# Patient Record
Sex: Female | Born: 1975 | Race: Black or African American | Hispanic: No | Marital: Married | Smoking: Never smoker
Health system: Southern US, Community
[De-identification: ages and names within clinical notes are randomized; demographics above are authoritative.]

## PROBLEM LIST (undated history)

## (undated) DIAGNOSIS — B009 Herpesviral infection, unspecified: Secondary | ICD-10-CM

## (undated) DIAGNOSIS — D649 Anemia, unspecified: Secondary | ICD-10-CM

## (undated) DIAGNOSIS — O24419 Gestational diabetes mellitus in pregnancy, unspecified control: Secondary | ICD-10-CM

## (undated) DIAGNOSIS — Z5189 Encounter for other specified aftercare: Secondary | ICD-10-CM

## (undated) DIAGNOSIS — R809 Proteinuria, unspecified: Secondary | ICD-10-CM

## (undated) DIAGNOSIS — O09299 Supervision of pregnancy with other poor reproductive or obstetric history, unspecified trimester: Secondary | ICD-10-CM

## (undated) DIAGNOSIS — K5190 Ulcerative colitis, unspecified, without complications: Secondary | ICD-10-CM

## (undated) DIAGNOSIS — O3421 Maternal care for scar from previous cesarean delivery: Secondary | ICD-10-CM

## (undated) DIAGNOSIS — O34219 Maternal care for unspecified type scar from previous cesarean delivery: Secondary | ICD-10-CM

## (undated) DIAGNOSIS — O228X9 Other venous complications in pregnancy, unspecified trimester: Secondary | ICD-10-CM

## (undated) DIAGNOSIS — O9902 Anemia complicating childbirth: Secondary | ICD-10-CM

## (undated) DIAGNOSIS — B999 Unspecified infectious disease: Secondary | ICD-10-CM

## (undated) DIAGNOSIS — D259 Leiomyoma of uterus, unspecified: Secondary | ICD-10-CM

## (undated) HISTORY — DX: Proteinuria, unspecified: R80.9

## (undated) HISTORY — DX: Ulcerative colitis, unspecified, without complications: K51.90

## (undated) HISTORY — DX: Other venous complications in pregnancy, unspecified trimester: O22.8X9

## (undated) HISTORY — DX: Maternal care for scar from previous cesarean delivery: O34.21

## (undated) HISTORY — DX: Anemia, unspecified: D64.9

## (undated) HISTORY — PX: WISDOM TOOTH EXTRACTION: SHX21

## (undated) HISTORY — DX: Herpesviral infection, unspecified: B00.9

## (undated) HISTORY — DX: Leiomyoma of uterus, unspecified: D25.9

## (undated) HISTORY — PX: HERNIA REPAIR: SHX51

## (undated) HISTORY — DX: Supervision of pregnancy with other poor reproductive or obstetric history, unspecified trimester: O09.299

## (undated) HISTORY — DX: Unspecified infectious disease: B99.9

## (undated) HISTORY — PX: SIGMOIDOSCOPY: SUR1295

## (undated) HISTORY — DX: Encounter for other specified aftercare: Z51.89

## (undated) HISTORY — DX: Gestational diabetes mellitus in pregnancy, unspecified control: O24.419

---

## 1998-09-10 DIAGNOSIS — B999 Unspecified infectious disease: Secondary | ICD-10-CM

## 1998-09-10 HISTORY — DX: Unspecified infectious disease: B99.9

## 2007-09-11 DIAGNOSIS — D259 Leiomyoma of uterus, unspecified: Secondary | ICD-10-CM

## 2007-09-11 HISTORY — DX: Leiomyoma of uterus, unspecified: D25.9

## 2008-01-20 ENCOUNTER — Ambulatory Visit: Payer: Self-pay | Admitting: Hematology & Oncology

## 2008-05-11 ENCOUNTER — Encounter: Admission: RE | Admit: 2008-05-11 | Discharge: 2008-05-11 | Payer: Self-pay | Admitting: Obstetrics and Gynecology

## 2008-07-20 ENCOUNTER — Encounter: Admission: RE | Admit: 2008-07-20 | Discharge: 2008-07-20 | Payer: Self-pay | Admitting: Obstetrics and Gynecology

## 2008-08-15 ENCOUNTER — Inpatient Hospital Stay (HOSPITAL_COMMUNITY): Admission: AD | Admit: 2008-08-15 | Discharge: 2008-08-15 | Payer: Self-pay | Admitting: Obstetrics & Gynecology

## 2008-09-07 ENCOUNTER — Inpatient Hospital Stay (HOSPITAL_COMMUNITY): Admission: AD | Admit: 2008-09-07 | Discharge: 2008-09-11 | Payer: Self-pay | Admitting: Obstetrics and Gynecology

## 2011-01-23 NOTE — Op Note (Signed)
Katherine Mitchell, Katherine Mitchell           ACCOUNT NO.:  [id number]   MEDICAL RECORD NO.:  [id number]          PATIENT TYPE:  INP   LOCATION:  9137                          FACILITY:  [facility]   PHYSICIAN:  Servando Salina, M.D.DATE OF BIRTH:  05/09/1976   DATE OF PROCEDURE:  09/08/2008  DATE OF DISCHARGE:                               OPERATIVE REPORT   PREOPERATIVE DIAGNOSES:  Nonreassuring fetal tracing, class A2  gestational diabetes, term gestation.   PROCEDURE:  Primary cesarean section Kerr hysterotomy.   POSTOPERATIVE DIAGNOSES:  Nonreassuring fetal tracing, class A2  gestational diabetes, term gestation.   ANESTHESIA:  Spinal.   SURGEON:  Servando Salina, MD   ASSISTANT:  None.   INDICATIONS:  A 35 year old gravida 1, para 0 female at term with class  A2 gestational diabetes admitted on September 07, 2008, for induction of  labor.  The patient had Cytotec placement x2.  Subsequently developed  tachysystole and late decelerations.  Subcutaneous terbutaline was given  in order to try to space the contractions.  The patient subsequently  continued to have contractions every minute, half a minute to a minute  long with repetitive late decelerations.  Her cervical exam showed that  she was closed 60% -3.  Given the exam and inability to further assess  the fetus, decision was then made to proceed with a primary cesarean  section.  Surgical risk was reviewed with the patient.  Consent was  signed.  The patient was transferred to the operating room.   PROCEDURE:  Under adequate spinal anesthesia, the patient was placed in  the supine position with a left lateral tilt.  She was sterilely prepped  and draped in usual fashion.  Indwelling Foley catheter was sterilely  placed.  A 0.25% Marcaine was injected along the planned Pfannenstiel  skin incision site.  Pfannenstiel skin incision was then made, carried  down to the rectus fascia.  Rectus fascia was opened transversely.  Rectus  fascia was then bluntly and sharply dissected off the rectus  muscle in superior and inferior fashion.  The rectus muscles split in  midline.  The parietal peritoneum was entered sharply and extended.  The  vesicouterine peritoneum was well developed.  Vesicouterine peritoneum  was opened transversely.  Bladder was then bluntly dissected off the  lower uterine segment and displaced inferiorly using a bladder  retractor.  Curvilinear low transverse uterine incision was then made  and extended with bandage scissors.  Copious amount of amniotic fluid  was noted.  Subsequent delivery of a live female from the right occiput  anterior position was accomplished.  Baby had a cord around the neck  which was reducible.  Cord was clamped and cut.  The baby was  transferred to the awaiting pediatrician who assigned Apgars 8 and 9 at  one and five minutes.  Placenta was manually removed.  Uterine cavity  was cleaned of debris.  Uterine incision had no extension and was closed  in two layers, the first layer with 0 Monocryl running locked stitch,  second layer was imbricated using 0 Monocryl suture.  Bleeding on the  right was hemostased with  a figure-of-eight 0 Monocryl suture.  Normal  tubes and ovaries were noted bilaterally.  A small subserosal fibroid  posteriorly was noted about to 2.5 cm near the fundal area.  The  parietal peritoneum was closed with 2-0 Vicryl.  The rectus fascia with  0 Vicryl x2.  The subcutaneous area was irrigated, small bleeders  cauterized.  Interrupted with 2-0 plain suture, placed in the skin  approximated with Ethicon staples.   SPECIMENS:  Placenta not sent to pathology.   CORD PH:  7.23.   INTRAOPERATIVE FLUID:  3 liters.   URINE OUTPUT:  125 mL clear yellow urine.   ESTIMATED BLOOD LOSS:  1000 mL.   Sponge and instrument counts x2 was correct.  Complication was none.  The patient tolerated procedure well and was transferred to recovery  room in stable  condition.  Weight of the baby was 7 pounds 8 ounces.      Servando Salina, M.D.  Electronically Signed     [place]/MEDQ  D:  09/08/2008  T:  09/08/2008  Job:  [id number]

## 2011-01-26 NOTE — Discharge Summary (Signed)
NAMEBENNETTE, HASTY           ACCOUNT NO.:  [id number]   MEDICAL RECORD NO.:  [id number]          PATIENT TYPE:  INP   LOCATION:  9137                          FACILITY:  [facility]   PHYSICIAN:  Servando Salina, M.D.DATE OF BIRTH:  [birth year]   DATE OF ADMISSION:  09/07/2008  DATE OF DISCHARGE:  09/11/2008                               DISCHARGE SUMMARY   ADMISSION DIAGNOSES:  Class A2 gestational diabetes, term gestation.   DISCHARGE DIAGNOSES:  Term gestation delivered, class A2 gestational  diabetes, and nonreassuring fetal tracing.   PROCEDURES:  Primary cesarean section  Kerr hysterotomy.   HISTORY OF PRESENT ILLNESS:  A 35 year old gravida 1, para 0 female at  13 weeks with class A2 gestational diabetes, admitted for induction of  labor.   HOSPITAL COURSE:  The patient was admitted for induction of labor.  Cytotec x2 was placed.  The patient developed tachysystole and  subcutaneous terbutaline was given.  The patient then began having  spontaneous contractions.  She subsequently had repetitive late  decelerations.  Cervix was closed, 60%, -3.  She was contracting every  half to one minute on her own.  Given the nonreassuring fetal tracing,  the decision was made to proceed with a primary cesarean section.  Primary C-section was done, live female, cord around the neck x1, right  occiput anterior position, cord pH 7.23, Apgars 8 and 9, normal tubes  and ovaries.  Postoperatively, the patient did well.  She was not  continuing on her glyburide.  CBC on postop day #1 showed a hemoglobin  of 9.6, hematocrit of 29.8, white count of 10.3, and platelet count  186,000.  By postop day #3, the patient had no evidence of infection.  She was tolerating the regular diet, passed flatus, and was deemed well  to be discharged home.   DISPOSITION:  To home.   CONDITION:  Stable.   DISCHARGE MEDICATIONS:  1. Percocet 1-2 tablets every 4-6 hours p.r.n. pain.  2. Prenatal vitamins 1 p.o.  daily.  3. Motrin 800 mg 1 p.o. q.8 h. p.r.n. pain.   FOLLOWUP APPOINTMENTS:  At [facility] in 6 weeks.   DISCHARGE INSTRUCTIONS:  Per the postpartum booklet given.     Servando Salina, M.D.  Electronically Signed    /MEDQ  D:  10/31/2008  T:  11/01/2008  Job:  [id number]

## 2011-04-05 LAB — GC/CHLAMYDIA PROBE AMP, GENITAL
Chlamydia: NEGATIVE
Gonorrhea: NEGATIVE

## 2011-04-05 LAB — RPR: RPR: NONREACTIVE

## 2011-04-05 LAB — ANTIBODY SCREEN: Antibody Screen: NEGATIVE

## 2011-04-05 LAB — HIV ANTIBODY (ROUTINE TESTING W REFLEX): HIV: NONREACTIVE

## 2011-06-15 LAB — GLUCOSE, CAPILLARY
Glucose-Capillary: 119 mg/dL — ABNORMAL HIGH (ref 70–99)
Glucose-Capillary: 54 mg/dL — ABNORMAL LOW (ref 70–99)
Glucose-Capillary: 70 mg/dL (ref 70–99)
Glucose-Capillary: 83 mg/dL (ref 70–99)
Glucose-Capillary: 86 mg/dL (ref 70–99)

## 2011-06-15 LAB — CBC
HCT: 29.8 % — ABNORMAL LOW (ref 36.0–46.0)
HCT: 34.2 % — ABNORMAL LOW (ref 36.0–46.0)
Hemoglobin: 9.6 g/dL — ABNORMAL LOW (ref 12.0–15.0)
MCHC: 32.3 g/dL (ref 30.0–36.0)
MCHC: 32.7 g/dL (ref 30.0–36.0)
MCV: 88.7 fL (ref 78.0–100.0)
MCV: 89 fL (ref 78.0–100.0)
Platelets: 186 10*3/uL (ref 150–400)
RBC: 3.34 MIL/uL — ABNORMAL LOW (ref 3.87–5.11)
RBC: 3.85 MIL/uL — ABNORMAL LOW (ref 3.87–5.11)
RDW: 14.8 % (ref 11.5–15.5)
WBC: 10.3 10*3/uL (ref 4.0–10.5)
WBC: 8.4 10*3/uL (ref 4.0–10.5)

## 2011-06-15 LAB — RPR: RPR Ser Ql: NONREACTIVE

## 2011-06-20 ENCOUNTER — Encounter: Payer: Managed Care, Other (non HMO) | Attending: Obstetrics and Gynecology | Admitting: Dietician

## 2011-06-20 DIAGNOSIS — Z713 Dietary counseling and surveillance: Secondary | ICD-10-CM | POA: Insufficient documentation

## 2011-06-20 DIAGNOSIS — O99810 Abnormal glucose complicating pregnancy: Secondary | ICD-10-CM | POA: Insufficient documentation

## 2011-06-21 ENCOUNTER — Encounter: Payer: Self-pay | Admitting: Dietician

## 2011-06-21 NOTE — Progress Notes (Signed)
  Patient was seen on 06/20/2011 for Gestational Diabetes self-management class at the [facility]. The following learning objectives were met by the patient during this course:   States the definition of Gestational Diabetes  States why dietary management is important in controlling blood glucose  Describes the effects each nutrient has on blood glucose levels  Demonstrates ability to create a balanced meal plan  Demonstrates carbohydrate counting   States when to check blood glucose levels  Demonstrates proper blood glucose monitoring techniques  States the effect of stress and exercise on blood glucose levels  States the importance of limiting caffeine and abstaining from alcohol and smoking  Blood glucose monitor given: One Touch Ultra Mini Lot # [id number] X Exp: 12/2011 Gluose reading: 84  Patient instructed to monitor glucose levels: Fasting and two hours past each meal. FBS: 60 - <90 1 hour: <140 2 hour: <120  Patient received handouts:  Nutrition Diabetes and Pregnancy  Carbohydrate Counting List  Patient will be seen for follow-up as needed.

## 2011-08-27 ENCOUNTER — Ambulatory Visit (INDEPENDENT_AMBULATORY_CARE_PROVIDER_SITE_OTHER): Payer: Managed Care, Other (non HMO)

## 2011-08-27 DIAGNOSIS — J111 Influenza due to unidentified influenza virus with other respiratory manifestations: Secondary | ICD-10-CM

## 2011-09-11 DIAGNOSIS — Z5189 Encounter for other specified aftercare: Secondary | ICD-10-CM

## 2011-09-11 HISTORY — DX: Encounter for other specified aftercare: Z51.89

## 2011-09-11 HISTORY — PX: DILATION AND CURETTAGE OF UTERUS: SHX78

## 2011-09-11 HISTORY — DX: Maternal care for unspecified type scar from previous cesarean delivery: O34.219

## 2011-09-11 NOTE — L&D Delivery Note (Signed)
Delivery Note At 5:56 AM a viable female was delivered via VBAC, Spontaneous (Presentation:LOA ;  ).  APGAR:9, 9 , ; weight .   Placenta status: Intact, Spontaneous.  Cord:3 vc  with the following complications:none .  Cord pH:   Anesthesia: Epidural  Episiotomy: none Lacerations: none Suture Repair: n/a Est. Blood Loss 350(mL):   Mom to postpartum.  Baby to nursery-stable.  Brieonna Crutcher DARLENE 11/12/2011, 6:04 AM

## 2011-10-16 LAB — STREP B DNA PROBE: GBS: NEGATIVE

## 2011-11-06 ENCOUNTER — Other Ambulatory Visit: Payer: Self-pay | Admitting: Obstetrics and Gynecology

## 2011-11-07 ENCOUNTER — Telehealth (HOSPITAL_COMMUNITY): Payer: Self-pay | Admitting: *Deleted

## 2011-11-07 ENCOUNTER — Encounter (HOSPITAL_COMMUNITY): Payer: Self-pay | Admitting: *Deleted

## 2011-11-07 NOTE — Telephone Encounter (Signed)
Preadmission screen  

## 2011-11-11 ENCOUNTER — Inpatient Hospital Stay (HOSPITAL_COMMUNITY)
Admission: RE | Admit: 2011-11-11 | Discharge: 2011-11-14 | DRG: 372 | Disposition: A | Discharge status: Home / Self Care | Payer: BC Managed Care – PPO | Source: Ambulatory Visit | Attending: Obstetrics and Gynecology | Admitting: Obstetrics and Gynecology

## 2011-11-11 DIAGNOSIS — O24419 Gestational diabetes mellitus in pregnancy, unspecified control: Secondary | ICD-10-CM | POA: Diagnosis present

## 2011-11-11 DIAGNOSIS — O9903 Anemia complicating the puerperium: Secondary | ICD-10-CM | POA: Diagnosis not present

## 2011-11-11 DIAGNOSIS — O34219 Maternal care for unspecified type scar from previous cesarean delivery: Secondary | ICD-10-CM | POA: Diagnosis present

## 2011-11-11 DIAGNOSIS — O9902 Anemia complicating childbirth: Secondary | ICD-10-CM | POA: Diagnosis present

## 2011-11-11 DIAGNOSIS — D649 Anemia, unspecified: Secondary | ICD-10-CM | POA: Diagnosis not present

## 2011-11-11 DIAGNOSIS — O99814 Abnormal glucose complicating childbirth: Principal | ICD-10-CM | POA: Diagnosis present

## 2011-11-11 DIAGNOSIS — O09529 Supervision of elderly multigravida, unspecified trimester: Secondary | ICD-10-CM | POA: Diagnosis present

## 2011-11-11 HISTORY — DX: Anemia complicating childbirth: O99.02

## 2011-11-11 LAB — CBC
Hemoglobin: 10.7 g/dL — ABNORMAL LOW (ref 12.0–15.0)
MCH: 26.8 pg (ref 26.0–34.0)
MCHC: 32.1 g/dL (ref 30.0–36.0)
MCV: 83.5 fL (ref 78.0–100.0)
RBC: 3.99 MIL/uL (ref 3.87–5.11)

## 2011-11-11 LAB — GLUCOSE, CAPILLARY

## 2011-11-11 MED ORDER — OXYTOCIN 20 UNITS IN LACTATED RINGERS INFUSION - SIMPLE: 125.0000 mL/h | Freq: Once | INTRAVENOUS | Status: DC

## 2011-11-11 MED ORDER — OXYTOCIN 20 UNITS IN LACTATED RINGERS INFUSION - SIMPLE
1.0000 m[IU]/min | INTRAVENOUS | Status: DC
  Administered 2011-11-11: 1 m[IU]/min via INTRAVENOUS
  Filled 2011-11-11: qty 1000

## 2011-11-11 MED ORDER — OXYTOCIN 20 UNITS IN LACTATED RINGERS INFUSION - SIMPLE: 1.0000 m[IU]/min | INTRAVENOUS | Status: DC

## 2011-11-11 MED ORDER — NALBUPHINE HCL 10 MG/ML IJ SOLN
10.0000 mg | INTRAMUSCULAR | Status: DC | PRN
  Administered 2011-11-11: 10 mg via INTRAVENOUS

## 2011-11-11 MED ORDER — ONDANSETRON HCL 4 MG/2ML IJ SOLN
4.0000 mg | Freq: Four times a day (QID) | INTRAMUSCULAR | Status: DC | PRN
  Administered 2011-11-11: 4 mg via INTRAVENOUS
  Filled 2011-11-11: qty 2

## 2011-11-11 MED ORDER — OXYCODONE-ACETAMINOPHEN 5-325 MG PO TABS: 1.0000 | ORAL_TABLET | ORAL | Status: DC | PRN

## 2011-11-11 MED ORDER — ACETAMINOPHEN 325 MG PO TABS: 650.0000 mg | ORAL_TABLET | ORAL | Status: DC | PRN

## 2011-11-11 MED ORDER — CITRIC ACID-SODIUM CITRATE 334-500 MG/5ML PO SOLN: 30.0000 mL | ORAL | Status: DC | PRN

## 2011-11-11 MED ORDER — LIDOCAINE HCL (PF) 1 % IJ SOLN
30.0000 mL | INTRAMUSCULAR | Status: DC | PRN
  Filled 2011-11-11: qty 30

## 2011-11-11 MED ORDER — LACTATED RINGERS IV SOLN
500.0000 mL | INTRAVENOUS | Status: DC | PRN
  Administered 2011-11-12: 300 mL via INTRAVENOUS

## 2011-11-11 MED ORDER — TERBUTALINE SULFATE 1 MG/ML IJ SOLN: 0.2500 mg | Freq: Once | INTRAMUSCULAR | Status: AC | PRN

## 2011-11-11 MED ORDER — OXYTOCIN 10 UNIT/ML IJ SOLN: 10.0000 [IU] | Freq: Once | INTRAMUSCULAR | Status: DC

## 2011-11-11 MED ORDER — OXYTOCIN BOLUS FROM INFUSION
500.0000 mL | Freq: Once | INTRAVENOUS | Status: AC
  Administered 2011-11-12: 500 mL via INTRAVENOUS
  Filled 2011-11-11: qty 500

## 2011-11-11 MED ORDER — LACTATED RINGERS IV SOLN
INTRAVENOUS | Status: DC
  Administered 2011-11-11 – 2011-11-12 (×3): via INTRAVENOUS

## 2011-11-11 MED ORDER — NALBUPHINE SYRINGE 5 MG/0.5 ML
10.0000 mg | INJECTION | INTRAMUSCULAR | Status: DC | PRN
  Administered 2011-11-11: 10 mg via INTRAVENOUS
  Filled 2011-11-11 (×3): qty 1

## 2011-11-11 MED ORDER — ZOLPIDEM TARTRATE 10 MG PO TABS: 10.0000 mg | ORAL_TABLET | Freq: Every evening | ORAL | Status: DC | PRN

## 2011-11-11 MED ORDER — IBUPROFEN 600 MG PO TABS
600.0000 mg | ORAL_TABLET | Freq: Four times a day (QID) | ORAL | Status: DC | PRN
  Administered 2011-11-12: 600 mg via ORAL
  Filled 2011-11-11: qty 1

## 2011-11-11 NOTE — H&P (Signed)
Katherine Mitchell is a 36 y.o. female now @ 39 6/7 wks presenting for induction 2nd to Class A2 GDM. Hx notable for previous LTCS History OB History    Grav Para Term Preterm Abortions TAB SAB Ect Mult Living   3 1 1  1  1   1      Past Medical History  Diagnosis Date  . Pregnancy with other poor obstetric history   . Previous cesarean delivery, unspecified as to episode of care or not applicable   . Ulcerative colitis, unspecified   . Leiomyoma of uterus, unspecified   . Other venous complication of pregnancy and the puerperium, unspecified as to episode of care     hemorrhoids  . Proteinuria   . Anemia   . History of maternal blood transfusion, currently pregnant   . Gestational diabetes     glyburide   Past Surgical History  Procedure Date  . Cesarean section   . Wisdom tooth extraction   . Hernia repair     umbilical  . Sigmoidoscopy    Family History: family history includes Diabetes in her mother. Social History:  reports that she has never smoked. She has never used smokeless tobacco. She reports that she does not drink alcohol or use illicit drugs.  ROS neg    Blood pressure 107/69, pulse 96, temperature 98.9 F (37.2 C), temperature source Oral, resp. rate 18, height 5' 5"  (1.651 m), weight 187 lb (84.823 kg). Maternal Exam:  Uterine Assessment: Contraction strength is mild.  Contraction frequency is irregular.   Abdomen: Patient reports no abdominal tenderness. Surgical scars: low transverse.   Estimated fetal weight is 8lb.   Fetal presentation: vertex  Introitus: Normal vulva. Normal vagina.  Ferning test: not done.  Nitrazine test: not done. Amniotic fluid character: not assessed.  Pelvis: adequate for delivery.   Cervix: Cervix evaluated by digital exam.     Fetal Exam Fetal Monitor Review: Mode: ultrasound.   Baseline rate: 120.  Variability: moderate (6-25 bpm).   Pattern: accelerations present.    Fetal State Assessment: Category I - tracings  are normal.     Physical Exam  Constitutional: She is oriented to person, place, and time. She appears well-developed and well-nourished.  HENT:  Head: Normocephalic.  Neck: Neck supple.  Respiratory: Breath sounds normal.  Musculoskeletal: Normal range of motion.  Neurological: She is alert and oriented to person, place, and time.  Skin: Skin is warm and dry.  Psychiatric: She has a normal mood and affect.   VE: 1/70/-4 soft Prenatal labs: ABO, Rh: O/Positive/-- (07/26 0000) Antibody: Negative (07/26 0000) Rubella: Immune (07/26 0000) RPR: Nonreactive (07/26 0000)  HBsAg: Negative (07/26 0000)  HIV: Non-reactive (07/26 0000)  GBS: Negative (02/05 0000)   Assessment/Plan: Class A2 GDM Prev LTCS Term gestation  P) admit BS q 4 hrs, routine labs. Intracervical balloon and low dose pitocin. Analgesic prn  Jaelen Soth A 11/11/2011, 6:14 PM

## 2011-11-11 NOTE — Progress Notes (Signed)
Intracervical balloon placed without difficulty. Reactive NST

## 2011-11-12 ENCOUNTER — Encounter (HOSPITAL_COMMUNITY): Payer: Self-pay | Admitting: Anesthesiology

## 2011-11-12 ENCOUNTER — Inpatient Hospital Stay (HOSPITAL_COMMUNITY): Admission: RE | Admit: 2011-11-12 | Payer: Managed Care, Other (non HMO) | Source: Ambulatory Visit

## 2011-11-12 ENCOUNTER — Inpatient Hospital Stay (HOSPITAL_COMMUNITY): Payer: BC Managed Care – PPO | Admitting: Anesthesiology

## 2011-11-12 ENCOUNTER — Inpatient Hospital Stay (HOSPITAL_COMMUNITY): Admission: AD | Admit: 2011-11-12 | Payer: Self-pay | Source: Ambulatory Visit | Admitting: Obstetrics and Gynecology

## 2011-11-12 ENCOUNTER — Encounter (HOSPITAL_COMMUNITY): Payer: Self-pay

## 2011-11-12 DIAGNOSIS — O34219 Maternal care for unspecified type scar from previous cesarean delivery: Secondary | ICD-10-CM | POA: Diagnosis present

## 2011-11-12 DIAGNOSIS — O24419 Gestational diabetes mellitus in pregnancy, unspecified control: Secondary | ICD-10-CM | POA: Diagnosis present

## 2011-11-12 LAB — ABO/RH: ABO/RH(D): O POS

## 2011-11-12 LAB — GLUCOSE, CAPILLARY: Glucose-Capillary: 88 mg/dL (ref 70–99)

## 2011-11-12 MED ORDER — BENZOCAINE-MENTHOL 20-0.5 % EX AERO: 1.0000 "application " | INHALATION_SPRAY | CUTANEOUS | Status: DC | PRN

## 2011-11-12 MED ORDER — ONDANSETRON HCL 4 MG PO TABS: 4.0000 mg | ORAL_TABLET | ORAL | Status: DC | PRN

## 2011-11-12 MED ORDER — SENNOSIDES-DOCUSATE SODIUM 8.6-50 MG PO TABS
2.0000 | ORAL_TABLET | Freq: Every day | ORAL | Status: DC
  Administered 2011-11-12 – 2011-11-13 (×2): 2 via ORAL

## 2011-11-12 MED ORDER — PRENATAL MULTIVITAMIN CH
1.0000 | ORAL_TABLET | Freq: Every day | ORAL | Status: DC
  Administered 2011-11-12 – 2011-11-14 (×3): 1 via ORAL
  Filled 2011-11-12 (×3): qty 1

## 2011-11-12 MED ORDER — DIPHENHYDRAMINE HCL 25 MG PO CAPS: 25.0000 mg | ORAL_CAPSULE | Freq: Four times a day (QID) | ORAL | Status: DC | PRN

## 2011-11-12 MED ORDER — WITCH HAZEL-GLYCERIN EX PADS: 1.0000 "application " | MEDICATED_PAD | CUTANEOUS | Status: DC | PRN

## 2011-11-12 MED ORDER — ZOLPIDEM TARTRATE 5 MG PO TABS: 5.0000 mg | ORAL_TABLET | Freq: Every evening | ORAL | Status: DC | PRN

## 2011-11-12 MED ORDER — LANOLIN HYDROUS EX OINT: TOPICAL_OINTMENT | CUTANEOUS | Status: DC | PRN

## 2011-11-12 MED ORDER — LACTATED RINGERS IV SOLN: 500.0000 mL | Freq: Once | INTRAVENOUS | Status: DC

## 2011-11-12 MED ORDER — DIBUCAINE 1 % RE OINT: 1.0000 "application " | TOPICAL_OINTMENT | RECTAL | Status: DC | PRN

## 2011-11-12 MED ORDER — IBUPROFEN 600 MG PO TABS
600.0000 mg | ORAL_TABLET | Freq: Four times a day (QID) | ORAL | Status: DC
  Administered 2011-11-12 – 2011-11-14 (×9): 600 mg via ORAL
  Filled 2011-11-12 (×9): qty 1

## 2011-11-12 MED ORDER — LIDOCAINE HCL (PF) 1 % IJ SOLN
INTRAMUSCULAR | Status: DC | PRN
  Administered 2011-11-12 (×2): 4 mL

## 2011-11-12 MED ORDER — ONDANSETRON HCL 4 MG/2ML IJ SOLN: 4.0000 mg | INTRAMUSCULAR | Status: DC | PRN

## 2011-11-12 MED ORDER — OXYCODONE-ACETAMINOPHEN 5-325 MG PO TABS: 1.0000 | ORAL_TABLET | ORAL | Status: DC | PRN

## 2011-11-12 MED ORDER — SIMETHICONE 80 MG PO CHEW: 80.0000 mg | CHEWABLE_TABLET | ORAL | Status: DC | PRN

## 2011-11-12 MED ORDER — TETANUS-DIPHTH-ACELL PERTUSSIS 5-2.5-18.5 LF-MCG/0.5 IM SUSP
0.5000 mL | Freq: Once | INTRAMUSCULAR | Status: AC
  Administered 2011-11-14: 0.5 mL via INTRAMUSCULAR
  Filled 2011-11-12 (×2): qty 0.5

## 2011-11-12 MED ORDER — EPHEDRINE 5 MG/ML INJ: 10.0000 mg | INTRAVENOUS | Status: DC | PRN

## 2011-11-12 MED ORDER — FENTANYL 2.5 MCG/ML BUPIVACAINE 1/10 % EPIDURAL INFUSION (WH - ANES)
INTRAMUSCULAR | Status: DC | PRN
  Administered 2011-11-12: 14 mL/h via EPIDURAL

## 2011-11-12 MED ORDER — PHENYLEPHRINE 40 MCG/ML (10ML) SYRINGE FOR IV PUSH (FOR BLOOD PRESSURE SUPPORT)
80.0000 ug | PREFILLED_SYRINGE | INTRAVENOUS | Status: DC | PRN
  Filled 2011-11-12: qty 5

## 2011-11-12 MED ORDER — FENTANYL 2.5 MCG/ML BUPIVACAINE 1/10 % EPIDURAL INFUSION (WH - ANES)
14.0000 mL/h | INTRAMUSCULAR | Status: DC
  Filled 2011-11-12 (×2): qty 60

## 2011-11-12 MED ORDER — EPHEDRINE 5 MG/ML INJ
10.0000 mg | INTRAVENOUS | Status: DC | PRN
  Filled 2011-11-12: qty 4

## 2011-11-12 MED ORDER — DIPHENHYDRAMINE HCL 50 MG/ML IJ SOLN
12.5000 mg | INTRAMUSCULAR | Status: DC | PRN
Start: 2011-11-12 — End: 2011-11-12

## 2011-11-12 MED ORDER — PHENYLEPHRINE 40 MCG/ML (10ML) SYRINGE FOR IV PUSH (FOR BLOOD PRESSURE SUPPORT): 80.0000 ug | PREFILLED_SYRINGE | INTRAVENOUS | Status: DC | PRN

## 2011-11-12 NOTE — Progress Notes (Signed)
INTERVAL NOTE:  S:  Sitting in bed, kangaroo care, plans to breastfeed, min cramping, (+) voids, moderate bleed, denies HA/NV/dizziness  O:  VSS, AAO x 3, NAD  FF bellow U  Moderate lochia  A / P:   PPD #0 s/p VBAC, GDM A2 delivered  Stable post partum  Routine PP orders  Leelan Rajewski, CNM, MSN  11/12/2011 11:17 AM

## 2011-11-12 NOTE — Progress Notes (Signed)
S; more comfortable since epidural SROM clear fluid @ 11:57 pm Foley balloon out  O:  BP 101/57 Pitocin 5 MIU  VE 5/80/-2 ROP asynclytic  IUPC placed.   BS=85 Tracing: baseline 120 reactive. (-)early decel (+) variable x 1 Ctx q 2 mins  Imp: Prev c/s desires VBAC Class A2  GDM Term gestation  P) exaggerated right sims. Cont pitocin

## 2011-11-12 NOTE — Progress Notes (Signed)
Garwin Brothers, MD, notified of tachysystole and SVE. Orders received to turn pitocin down to 2 milliunits.

## 2011-11-12 NOTE — Progress Notes (Signed)
Called for pt with epidural  9 cm (0 to +1 station). On arrival pt delivered w/ intact perineum

## 2011-11-12 NOTE — Anesthesia Preprocedure Evaluation (Signed)
Anesthesia Evaluation  Patient identified by MRN, date of birth, ID band Patient awake    Reviewed: Allergy & Precautions, H&P , Patient's Chart, lab work & pertinent test results  Airway Mallampati: III TM Distance: >3 FB Neck ROM: full    Dental No notable dental hx. (+) Teeth Intact   Pulmonary neg pulmonary ROS,  breath sounds clear to auscultation  Pulmonary exam normal       Cardiovascular negative cardio ROS  Rhythm:regular Rate:Normal     Neuro/Psych negative neurological ROS  negative psych ROS   GI/Hepatic Neg liver ROS, PUD,   Endo/Other  Diabetes mellitus-, Well Controlled, Gestational  Renal/GU negative Renal ROS  negative genitourinary   Musculoskeletal   Abdominal Normal abdominal exam  (+)   Peds  Hematology negative hematology ROS (+)   Anesthesia Other Findings   Reproductive/Obstetrics (+) Pregnancy                           Anesthesia Physical Anesthesia Plan  ASA: II  Anesthesia Plan: Epidural   Post-op Pain Management:    Induction:   Airway Management Planned:   Additional Equipment:   Intra-op Plan:   Post-operative Plan:   Informed Consent: I have reviewed the patients History and Physical, chart, labs and discussed the procedure including the risks, benefits and alternatives for the proposed anesthesia with the patient or authorized representative who has indicated his/her understanding and acceptance.     Plan Discussed with: Anesthesiologist and Surgeon  Anesthesia Plan Comments:         Anesthesia Quick Evaluation

## 2011-11-12 NOTE — Anesthesia Procedure Notes (Signed)
Epidural Patient location during procedure: OB Start time: 11/12/2011 12:35 AM  Staffing Anesthesiologist: Tola Meas A. Performed by: anesthesiologist   Preanesthetic Checklist Completed: patient identified, site marked, surgical consent, pre-op evaluation, timeout performed, IV checked, risks and benefits discussed and monitors and equipment checked  Epidural Patient position: sitting Prep: site prepped and draped and DuraPrep Patient monitoring: continuous pulse ox and blood pressure Approach: midline Injection technique: LOR air  Needle:  Needle type: Tuohy  Needle gauge: 17 G Needle length: 9 cm Needle insertion depth: 6 cm Catheter type: closed end flexible Catheter size: 19 Gauge Catheter at skin depth: 11 cm Test dose: negative and Other  Assessment Events: blood not aspirated, injection not painful, no injection resistance, negative IV test and no paresthesia  Additional Notes Patient identified. Risks and benefits discussed including failed block, incomplete  Pain control, post dural puncture headache, nerve damage, paralysis, blood pressure Changes, nausea, vomiting, reactions to medications-both toxic and allergic and post Partum back pain. All questions were answered. Patient expressed understanding and wished to proceed. Sterile technique was used throughout procedure. Epidural site was Dressed with sterile barrier dressing. No paresthesias, signs of intravascular injection Or signs of intrathecal spread were encountered.  Patient was more comfortable after the epidural was dosed. Please see RN's note for documentation of vital signs and FHR which are stable.

## 2011-11-13 LAB — CBC
HCT: 29.7 % — ABNORMAL LOW (ref 36.0–46.0)
Hemoglobin: 9.6 g/dL — ABNORMAL LOW (ref 12.0–15.0)
MCHC: 32.3 g/dL (ref 30.0–36.0)

## 2011-11-13 NOTE — Progress Notes (Signed)
Patient ID: Katherine Mitchell, female   DOB: 1976/05/09, 36 y.o.   MRN: [id number]  PPD 1 SVD  S:  Reports feeling well / tired mostly             Tolerating po/ No nausea or vomiting             Bleeding is light             Pain controlled withmotrin and percocet             Up ad lib / ambulatory  Newborn breast feeding  / Circumcision requested today   O:  A & O x 3 NAD             VS: Blood pressure 85/49, pulse 85, temperature 97.7 F (36.5 C), temperature source Oral, resp. rate 18, height 5' 5"  (1.651 m), weight 84.823 kg (187 lb), SpO2 98.00%, unknown if currently breastfeeding.  LABS: WBC/Hgb/Hct/Plts:  16.4/9.6/29.7/181 (03/05 0525)   Lungs: Clear and unlabored  Heart: regular rate and rhythm / no mumurs  Abdomen: soft, non-tender, non-distended              Fundus: firm, non-tender, U-1  Perineum: mild edema - ice pack in place  Lochia: light  Extremities: no edema, no calf pain or tenderness    A: PPD # 1 SVD / VBAC   Doing well - stable status  P:  Routine post partum orders  Newborn circ today             Discharge in am - will notify nurse if decides to go home early this pm after circ  Matheson Vandehei, Lavella Lemons 11/13/2011, 9:00 AM

## 2011-11-13 NOTE — Anesthesia Postprocedure Evaluation (Signed)
  Anesthesia Post-op Note  Patient: Katherine Mitchell  Procedure(s) Performed: * No procedures listed *  Patient Location: Mother/Baby  Anesthesia Type: Epidural  Level of Consciousness: awake  Airway and Oxygen Therapy: Patient Spontanous Breathing  Post-op Pain: none  Post-op Assessment: Patient's Cardiovascular Status Stable, Respiratory Function Stable, No signs of Nausea or vomiting, Adequate PO intake, Pain level controlled, No headache, No backache, No residual numbness and No residual motor weakness  Post-op Vital Signs: Reviewed and stable  Complications: No apparent anesthesia complications

## 2011-11-14 ENCOUNTER — Encounter (HOSPITAL_COMMUNITY): Payer: Self-pay

## 2011-11-14 DIAGNOSIS — O9902 Anemia complicating childbirth: Secondary | ICD-10-CM

## 2011-11-14 HISTORY — DX: Anemia complicating childbirth: O99.02

## 2011-11-14 MED ORDER — FERRALET 90 90-1 MG PO TABS: 1.0000 | ORAL_TABLET | Freq: Every day | ORAL | Status: DC

## 2011-11-14 MED ORDER — IBUPROFEN 600 MG PO TABS: 600.0000 mg | ORAL_TABLET | Freq: Four times a day (QID) | ORAL | Status: AC

## 2011-11-14 NOTE — Discharge Summary (Signed)
Obstetric Discharge Summary Reason for Admission: induction of labor and gestational diabetes class A2, trial of labor after cesarean section Prenatal Procedures: ultrasound Intrapartum Procedures: Successful VBAC Postpartum Procedures: none Complications-Operative and Postpartum: none Hemoglobin  Date Value Range Status  11/13/2011 9.6* 12.0-15.0 (g/dL) Final     HCT  Date Value Range Status  11/13/2011 29.7* 36.0-46.0 (%) Final    Discharge Diagnoses: Term Pregnancy-delivered Maternal anemia, mild Discharge Information: Date: 11/14/2011 Activity: pelvic rest Diet: routine Medications: PNV, Ibuprofen and Iron Condition: stable Instructions: refer to practice specific booklet Discharge to: home Follow-up Information    Follow up with COUSINS,SHERONETTE A, MD in 6 weeks.   Contact information:   [place] [phone]          Newborn Data: Live born female "Darlyn Chamber" Birth Weight: 8 lb 6.4 oz (3810 g) APGAR: 9, 9  Continues in-patient on double phototherapy, anticipate discharge home tomorrow, mother to room in.  PAUL,DANIELA 11/14/2011, 9:30 AM  Reviewed, agree w note and plan. --V.Benjie Karvonen, MD

## 2011-11-14 NOTE — Discharge Instructions (Signed)
Breast Pumping Tips Pumping your breast milk is a good way to stimulate milk production and have a steady supply of breast milk for your infant. Pumping is most helpful during your infant's growth spurts, when involving dad or a family member, or when you are away. There are several types of pumps available. They can be purchased at a baby or maternity store. You can begin pumping soon after delivery, but some experts believe that you should wait about four weeks to give your infant a bottle. In general, the more you breastfeed or pump, the more milk you will have for your infant. It is also important to take good care of yourself. This will reduce stress and help your body to create a healthy supply of milk. Your caregiver or lactation consultant can give you the information and support you need in your efforts to breastfeed your infant. PUMPING BREAST MILK  Follow the tips below for successful breast pumping. Take care of yourself.  Drink enough water or fluids to keep urine clear or pale yellow. You may notice a thirsty feeling while breastfeeding. This is because your body needs more water to make breast milk. Keep a large water bottle handy. Make healthy drink choices such as unsweetened fruit juice, milk and water. Limit soda, coffee, and alcohol (wait 2 hours to feed or pump if you have an alcoholic drink.)   Eat a healthy, well-balanced diet rich in fruits, vegetables, and whole grains.   Exercise as recommended by your caregiver.   Get plenty of sleep. Sleep when your infant sleeps. Ask friends and family for help if you need time to nap or rest.   Do not smoke. Smoking can lower your milk supply and harm your infant. If you need help quitting, ask your caregiver for a program recommendation.   Ask your caregiver about birth control options. Birth control pills may lower your milk supply. You may be advised to use condoms or other forms of birth control.  Relax and pump Stimulating your  let-down reflex is the key to successful and effective pumping. This makes the milk in all parts of the breast flow more freely.   It is easier to pump breast milk (and breastfeed) while you are relaxed. Find techniques that work for you. Quiet private spaces, breast massage, soothing heat placed on the breast, music, and pictures or a tape recording of your infant may help you to relax and "let down" your milk. If you have difficulty with your let down, try smelling one of your infant's blankets or an item of clothing he or she has worn while you are pumping.   When pumping, place the special suction cup (flange) directly over the nipple. It may be uncomfortable and cause nipple damage if it is not placed properly or is the wrong size. Applying a small amount of purified or modified lanolin to your nipple and the areola may help increase your comfort level. Also, you can change the speed and suction of many electric pumps to your comfort level. Your caregiver or lactation consultant can help you with this.   If pumping continues to be painful, or you feel you are not getting very much milk when you pump, you may need a different type of pump. A lactation consultant can help you determine if this is the case.   If you are with your infant, feed him or her on demand and try pumping after each feeding. This will boost your production, even if milk  does not come out. You may not be able to pump much milk at first, but keep up the routine, and this will change.   If you are working or away from your infant for several hours, try pumping for about 15 minutes every 2 to 3 hours. Pump both breasts at the same time if you can.   If your infant has a formula feeding, make sure you pump your milk around the same time to maintain your supply.   Begin pumping breast milk a few weeks before you return to work. This will help you develop techniques that work for you and will be able to store extra milk.   Find a  source of breastfeeding information that works well for you.  TIPS FOR STORING BREAST MILK  Store breast milk in a sealable sterile bag, jar, or container provided with your pumping supplies.   Store milk in small amounts close to what your infant is drinking at each feeding.   Cool pumped milk in a refrigerator or cooler. Pumped milk can last at the back of the refrigerator for 3 to 8 days.   Place cooled milk at the back of the freezer for up to 3 months.   Thaw the milk in its container or bag in warm water up to 24 hours in advance. Do not use a microwave to thaw or heat milk. Do not refreeze the milk after it has been thawed.   Breast milk is safe to drink when left at room temperature (mid 70s or colder) for 4 to 8 hours. After that, throw it away.   Milk fat can separate and look funny. The color can vary slightly from day to day. This is normal. Always shake the milk before using it to mix the fat with the more watery portion.  SEEK MEDICAL CARE IF:   You are having trouble pumping or feeding your infant.   You are concerned that you are not making enough milk.   You have nipple pain, soreness, or redness.   You have other questions or concerns related to you or your infant.  Document Released: 02/14/2010 Document Revised: 08/16/2011 Document Reviewed: 02/14/2010 [facility] Patient Information 2012 [place].Iron Deficiency Anemia There are many types of anemia. Iron deficiency anemia is the most common. Iron deficiency anemia is a decrease in the number of red blood cells caused by too little iron. Without enough iron, your body does not produce enough hemoglobin. Hemoglobin is a substance in red blood cells that carries oxygen to the body's tissues. Iron deficiency anemia may leave you tired and short of breath. CAUSES   Lack of iron in the diet.   This may be seen in infants and children, because there is little iron in milk.   This may be seen in adults who do not  eat enough iron-rich foods.   This may be seen in pregnant or breastfeeding women who do not take iron supplements. There is a much higher need for iron intake at these times.   Poor absorption of iron, as seen with intestinal disorders.   Intestinal bleeding.   Heavy periods.  SYMPTOMS  Mild anemia may not be noticeable. Symptoms may include:  Fatigue.   Headache.   Pale skin.   Weakness.   Shortness of breath.   Dizziness.   Cold hands and feet.   Fast or irregular heartbeat.  DIAGNOSIS  Diagnosis requires a thorough evaluation and physical exam by your caregiver.  Blood tests are  generally used to confirm iron deficiency anemia.   Additional tests may be done to find the underlying cause of your anemia. These may include:   Testing for blood in the stool (fecal occult blood test).   A procedure to see inside the colon and rectum (colonoscopy).   A procedure to see inside the esophagus and stomach (endoscopy).  TREATMENT   Correcting the cause of the iron deficiency is the first step.   Medicines, such as oral contraceptives, can make heavy menstrual flows lighter.   Antibiotics and other medicines can be used to treat peptic ulcers.   Surgery may be needed to remove a bleeding polyp, tumor, or fibroid.   Often, iron supplements (ferrous sulfate) are taken.   For the best iron absorption, take these supplements with an empty stomach.   You may need to take the supplements with food if you cannot tolerate them on an empty stomach. Vitamin C improves the absorption of iron. Your caregiver may recommend taking your iron tablets with a glass of orange juice or vitamin C supplement.   Milk and antacids should not be taken at the same time as iron supplements. They may interfere with the absorption of iron.   Iron supplements can cause constipation. A stool softener is often recommended.   Pregnant and breastfeeding women will need to take extra iron, because  their normal diet usually will not provide the required amount.   Patients who cannot tolerate iron by mouth can take it through a vein (intravenously) or by an injection into the muscle.  HOME CARE INSTRUCTIONS   Ask your dietitian for help with diet questions.   Take iron and vitamins as directed by your caregiver.   Eat a diet rich in iron. Eat liver, lean beef, whole-grain bread, eggs, dried fruit, and dark green leafy vegetables.  SEEK IMMEDIATE MEDICAL CARE IF:   You have a fainting episode. Do not drive yourself. Call your local emergency services (911 in [place] if no other help is available.   You have chest pain, nausea, or vomiting.   You develop severe or increased shortness of breath with activities.   You develop weakness or increased thirst.   You have a rapid heartbeat.   You develop unexplained sweating or become lightheaded when getting up from a chair or bed.  MAKE SURE YOU:   Understand these instructions.   Will watch your condition.   Will get help right away if you are not doing well or get worse.  Document Released: 08/24/2000 Document Revised: 08/16/2011 Document Reviewed: 01/03/2010 [facility] Patient Information 2012 [place].

## 2011-11-14 NOTE — Progress Notes (Signed)
Post Partum Day #2            Information for the patient's newborn:  [place], Ramone [[id number]]  female   / circumcision done Feeding: breast, working with lactation on latch Infant on double photo therapy for elevate bili, remains in patient today  Subjective: No HA, SOB, CP, F/C, breast symptoms. Pain controlled with Motrin. Normal vaginal bleeding, no clots.      Objective:  Temp:  [97.9 F (36.6 C)-98.7 F (37.1 C)] 98.4 F (36.9 C) (03/06 0529) Pulse Rate:  [85-93] 85  (03/06 0529) Resp:  [18] 18  (03/06 0529) BP: (86-106)/(53-62) 86/55 mmHg (03/06 0529)  No intake or output data in the 24 hours ending 11/14/11 0924     Basename 11/13/11 0525 11/11/11 1820  WBC 16.4* 8.4  HGB 9.6* 10.7*  HCT 29.7* 33.3*  PLT 181 217    Blood type: --/--/O POS (03/03 1820) Rubella: Immune (07/26 0000)    Physical Exam:  General: alert, cooperative and no distress Uterine Fundus: firm Lochia: appropriate Perineum: intact, edema none DVT Evaluation: Negative Homan's sign. No significant calf/ankle edema.    Assessment/Plan: PPD # 2 / 36 y.o., [id number] S/P:VBAC   Principal Problem:  *PP care - s/p VBAC 3/4 Active Problems:  GDM, class A2  Maternal anemia, with delivery  Asymptomatic, oral Fe 4-6 wks   normal postpartum exam  Continue current postpartum care  D/C from in-patient service, will continue to room in with newborn, anticipate going home tomorrow.   LOS: 3 days   Maliek Schellhorn, CNM, MSN 11/14/2011, 9:24 AM

## 2011-11-22 ENCOUNTER — Inpatient Hospital Stay (HOSPITAL_COMMUNITY): Payer: BC Managed Care – PPO

## 2011-11-22 ENCOUNTER — Ambulatory Visit: Admit: 2011-11-22 | Payer: Self-pay | Admitting: Obstetrics & Gynecology

## 2011-11-22 ENCOUNTER — Encounter (HOSPITAL_COMMUNITY): Payer: Self-pay | Admitting: Anesthesiology

## 2011-11-22 ENCOUNTER — Inpatient Hospital Stay (HOSPITAL_COMMUNITY)
Admission: AD | Admit: 2011-11-22 | Discharge: 2011-11-24 | DRG: 377 | Disposition: A | Discharge status: Home / Self Care | Payer: BC Managed Care – PPO | Source: Ambulatory Visit | Attending: Obstetrics & Gynecology | Admitting: Obstetrics & Gynecology

## 2011-11-22 ENCOUNTER — Encounter (HOSPITAL_COMMUNITY)
Admission: AD | Disposition: A | Discharge status: Home / Self Care | Payer: Self-pay | Source: Ambulatory Visit | Attending: Obstetrics & Gynecology

## 2011-11-22 ENCOUNTER — Encounter (HOSPITAL_COMMUNITY): Payer: Self-pay | Admitting: *Deleted

## 2011-11-22 ENCOUNTER — Inpatient Hospital Stay (HOSPITAL_COMMUNITY): Payer: BC Managed Care – PPO | Admitting: Anesthesiology

## 2011-11-22 DIAGNOSIS — O8612 Endometritis following delivery: Secondary | ICD-10-CM | POA: Diagnosis present

## 2011-11-22 DIAGNOSIS — O864 Pyrexia of unknown origin following delivery: Secondary | ICD-10-CM | POA: Diagnosis present

## 2011-11-22 DIAGNOSIS — D62 Acute posthemorrhagic anemia: Secondary | ICD-10-CM | POA: Diagnosis present

## 2011-11-22 DIAGNOSIS — O9081 Anemia of the puerperium: Secondary | ICD-10-CM | POA: Diagnosis present

## 2011-11-22 HISTORY — PX: DILATION AND EVACUATION: SHX1459

## 2011-11-22 LAB — CBC
HCT: 23.8 % — ABNORMAL LOW (ref 36.0–46.0)
Hemoglobin: 7.6 g/dL — ABNORMAL LOW (ref 12.0–15.0)
MCH: 26.7 pg (ref 26.0–34.0)
MCHC: 31.9 g/dL (ref 30.0–36.0)
RBC: 2.85 MIL/uL — ABNORMAL LOW (ref 3.87–5.11)

## 2011-11-22 LAB — COMPREHENSIVE METABOLIC PANEL
Alkaline Phosphatase: 86 U/L (ref 39–117)
BUN: 14 mg/dL (ref 6–23)
Chloride: 107 mEq/L (ref 96–112)
Creatinine, Ser: 0.55 mg/dL (ref 0.50–1.10)
GFR calc Af Amer: >90 mL/min (ref >90)
Glucose, Bld: 94 mg/dL (ref 70–99)
Potassium: 3.6 mEq/L (ref 3.5–5.1)
Total Bilirubin: 0.4 mg/dL (ref 0.3–1.2)
Total Protein: 5.3 g/dL — ABNORMAL LOW (ref 6.0–8.3)

## 2011-11-22 LAB — DIFFERENTIAL
Eosinophils Absolute: 0 10*3/uL (ref 0.0–0.7)
Lymphs Abs: 0.8 10*3/uL (ref 0.7–4.0)
Monocytes Absolute: 0.8 10*3/uL (ref 0.1–1.0)
Monocytes Relative: 4 % (ref 3–12)
Neutrophils Relative %: 91 % — ABNORMAL HIGH (ref 43–77)

## 2011-11-22 LAB — PREPARE RBC (CROSSMATCH)

## 2011-11-22 SURGERY — DILATION AND EVACUATION, UTERUS
Anesthesia: Monitor Anesthesia Care | Site: Uterus | Wound class: Clean Contaminated

## 2011-11-22 MED ORDER — FENTANYL CITRATE 0.05 MG/ML IJ SOLN
INTRAMUSCULAR | Status: AC
  Filled 2011-11-22: qty 2

## 2011-11-22 MED ORDER — PROMETHAZINE HCL 25 MG/ML IJ SOLN: 6.2500 mg | INTRAMUSCULAR | Status: DC | PRN

## 2011-11-22 MED ORDER — LACTATED RINGERS IV SOLN
INTRAVENOUS | Status: DC
  Administered 2011-11-22: 50 mL/h via INTRAVENOUS

## 2011-11-22 MED ORDER — PHENYLEPHRINE HCL 10 MG/ML IJ SOLN
INTRAMUSCULAR | Status: DC | PRN
  Administered 2011-11-22: 20 ug via INTRAVENOUS
  Administered 2011-11-22: 40 ug via INTRAVENOUS
  Administered 2011-11-22 (×4): 80 ug via INTRAVENOUS

## 2011-11-22 MED ORDER — PROPOFOL 10 MG/ML IV EMUL
INTRAVENOUS | Status: AC
  Filled 2011-11-22: qty 20

## 2011-11-22 MED ORDER — DEXTROSE 5 % IV SOLN
2.0000 g | Freq: Four times a day (QID) | INTRAVENOUS | Status: DC
  Administered 2011-11-22 – 2011-11-24 (×7): 2 g via INTRAVENOUS
  Filled 2011-11-22 (×8): qty 2

## 2011-11-22 MED ORDER — MIDAZOLAM HCL 5 MG/5ML IJ SOLN
INTRAMUSCULAR | Status: DC | PRN
  Administered 2011-11-22: 2 mg via INTRAVENOUS

## 2011-11-22 MED ORDER — MIDAZOLAM HCL 2 MG/2ML IJ SOLN
INTRAMUSCULAR | Status: AC
  Filled 2011-11-22: qty 2

## 2011-11-22 MED ORDER — ONDANSETRON HCL 4 MG/2ML IJ SOLN
INTRAMUSCULAR | Status: DC | PRN
  Administered 2011-11-22: 4 mg via INTRAVENOUS

## 2011-11-22 MED ORDER — FERRALET 90 90-1 MG PO TABS: 1.0000 | ORAL_TABLET | Freq: Every day | ORAL | Status: DC

## 2011-11-22 MED ORDER — LACTATED RINGERS IV SOLN
INTRAVENOUS | Status: DC | PRN
  Administered 2011-11-22 (×2): via INTRAVENOUS

## 2011-11-22 MED ORDER — PROPOFOL 10 MG/ML IV EMUL
INTRAVENOUS | Status: DC | PRN
  Administered 2011-11-22: 20 mg via INTRAVENOUS
  Administered 2011-11-22: 30 mg via INTRAVENOUS
  Administered 2011-11-22: 20 mg via INTRAVENOUS
  Administered 2011-11-22: 30 mg via INTRAVENOUS
  Administered 2011-11-22: 20 mg via INTRAVENOUS
  Administered 2011-11-22: 10 mg via INTRAVENOUS
  Administered 2011-11-22: 20 mg via INTRAVENOUS
  Administered 2011-11-22 (×2): 30 mg via INTRAVENOUS
  Administered 2011-11-22 (×4): 20 mg via INTRAVENOUS
  Administered 2011-11-22: 30 mg via INTRAVENOUS
  Administered 2011-11-22 (×4): 20 mg via INTRAVENOUS

## 2011-11-22 MED ORDER — SODIUM CHLORIDE 0.9 % IV SOLN
INTRAVENOUS | Status: DC
  Administered 2011-11-22: 13:00:00 via INTRAVENOUS

## 2011-11-22 MED ORDER — IBUPROFEN 600 MG PO TABS
600.0000 mg | ORAL_TABLET | Freq: Four times a day (QID) | ORAL | Status: DC | PRN
  Filled 2011-11-22 (×2): qty 1

## 2011-11-22 MED ORDER — LACTATED RINGERS IV BOLUS (SEPSIS)
1000.0000 mL | Freq: Once | INTRAVENOUS | Status: AC
  Administered 2011-11-22: 1000 mL via INTRAVENOUS

## 2011-11-22 MED ORDER — PHENYLEPHRINE 40 MCG/ML (10ML) SYRINGE FOR IV PUSH (FOR BLOOD PRESSURE SUPPORT)
PREFILLED_SYRINGE | INTRAVENOUS | Status: AC
  Filled 2011-11-22: qty 5

## 2011-11-22 MED ORDER — DEXTROSE 5 % IV SOLN
2.0000 g | Freq: Once | INTRAVENOUS | Status: AC
  Administered 2011-11-22: 2 g via INTRAVENOUS
  Filled 2011-11-22: qty 2

## 2011-11-22 MED ORDER — IBUPROFEN 600 MG PO TABS
600.0000 mg | ORAL_TABLET | Freq: Four times a day (QID) | ORAL | Status: DC
  Administered 2011-11-22 – 2011-11-24 (×6): 600 mg via ORAL
  Filled 2011-11-22 (×3): qty 1

## 2011-11-22 MED ORDER — PRENATAL MULTIVITAMIN CH
1.0000 | ORAL_TABLET | Freq: Every day | ORAL | Status: DC
  Administered 2011-11-23 – 2011-11-24 (×2): 1 via ORAL
  Filled 2011-11-22 (×3): qty 1

## 2011-11-22 MED ORDER — FE FUMARATE-B12-VIT C-FA-IFC PO CAPS
1.0000 | ORAL_CAPSULE | Freq: Every day | ORAL | Status: DC
  Administered 2011-11-23 – 2011-11-24 (×2): 1 via ORAL
  Filled 2011-11-22 (×3): qty 1

## 2011-11-22 MED ORDER — LIDOCAINE-EPINEPHRINE 1 %-1:100000 IJ SOLN
INTRAMUSCULAR | Status: DC | PRN
  Administered 2011-11-22: 30 mL

## 2011-11-22 MED ORDER — LIDOCAINE HCL (PF) 1 % IJ SOLN
INTRAMUSCULAR | Status: AC
  Filled 2011-11-22: qty 5

## 2011-11-22 MED ORDER — KETOROLAC TROMETHAMINE 30 MG/ML IJ SOLN: 15.0000 mg | Freq: Once | INTRAMUSCULAR | Status: DC | PRN

## 2011-11-22 MED ORDER — ONDANSETRON HCL 4 MG/2ML IJ SOLN: 4.0000 mg | Freq: Four times a day (QID) | INTRAMUSCULAR | Status: DC | PRN

## 2011-11-22 MED ORDER — ZOLPIDEM TARTRATE 5 MG PO TABS: 5.0000 mg | ORAL_TABLET | Freq: Every evening | ORAL | Status: DC | PRN

## 2011-11-22 MED ORDER — MEPERIDINE HCL 25 MG/ML IJ SOLN: 6.2500 mg | INTRAMUSCULAR | Status: DC | PRN

## 2011-11-22 MED ORDER — FENTANYL CITRATE 0.05 MG/ML IJ SOLN
INTRAMUSCULAR | Status: DC | PRN
  Administered 2011-11-22: 100 ug via INTRAVENOUS

## 2011-11-22 MED ORDER — SODIUM CHLORIDE 0.9 % IV SOLN
INTRAVENOUS | Status: DC
  Administered 2011-11-23 – 2011-11-24 (×4): via INTRAVENOUS

## 2011-11-22 MED ORDER — SODIUM CHLORIDE 0.9 % IV SOLN
INTRAVENOUS | Status: DC | PRN
  Administered 2011-11-22: 14:00:00 via INTRAVENOUS

## 2011-11-22 MED ORDER — MENTHOL 3 MG MT LOZG: 1.0000 | LOZENGE | OROMUCOSAL | Status: DC | PRN

## 2011-11-22 MED ORDER — METHYLERGONOVINE MALEATE 0.2 MG/ML IJ SOLN
INTRAMUSCULAR | Status: AC
  Administered 2011-11-22: 0.2 mg via INTRAMUSCULAR
  Filled 2011-11-22: qty 1

## 2011-11-22 MED ORDER — ONDANSETRON HCL 4 MG PO TABS: 4.0000 mg | ORAL_TABLET | Freq: Four times a day (QID) | ORAL | Status: DC | PRN

## 2011-11-22 MED ORDER — OXYCODONE-ACETAMINOPHEN 5-325 MG PO TABS: 1.0000 | ORAL_TABLET | ORAL | Status: DC | PRN

## 2011-11-22 MED ORDER — METHYLERGONOVINE MALEATE 0.2 MG/ML IJ SOLN
0.2000 mg | Freq: Three times a day (TID) | INTRAMUSCULAR | Status: DC
  Administered 2011-11-22 (×2): 0.2 mg via INTRAMUSCULAR
  Filled 2011-11-22: qty 1

## 2011-11-22 MED ORDER — KETOROLAC TROMETHAMINE 30 MG/ML IJ SOLN
30.0000 mg | Freq: Once | INTRAMUSCULAR | Status: AC
  Administered 2011-11-22: 30 mg via INTRAVENOUS
  Filled 2011-11-22: qty 1

## 2011-11-22 MED ORDER — HYDROMORPHONE HCL PF 1 MG/ML IJ SOLN: 0.2500 mg | INTRAMUSCULAR | Status: DC | PRN

## 2011-11-22 SURGICAL SUPPLY — 20 items
CATH ROBINSON RED A/P 16FR (CATHETERS) ×2 IMPLANT
CLOTH BEACON ORANGE TIMEOUT ST (SAFETY) ×2 IMPLANT
DECANTER SPIKE VIAL GLASS SM (MISCELLANEOUS) ×2 IMPLANT
DRAPE HYSTEROSCOPY (DRAPE) ×2 IMPLANT
GLOVE BIO SURGEON STRL SZ 6.5 (GLOVE) ×2 IMPLANT
GLOVE BIOGEL PI IND STRL 7.0 (GLOVE) ×2 IMPLANT
GLOVE BIOGEL PI INDICATOR 7.0 (GLOVE) ×2
KIT BERKELEY 1ST TRIMESTER 3/8 (MISCELLANEOUS) ×8 IMPLANT
NEEDLE SPNL 22GX3.5 QUINCKE BK (NEEDLE) ×2 IMPLANT
NS IRRIG 1000ML POUR BTL (IV SOLUTION) ×2 IMPLANT
PACK VAGINAL MINOR WOMEN LF (CUSTOM PROCEDURE TRAY) ×2 IMPLANT
PAD PREP 24X48 CUFFED NSTRL (MISCELLANEOUS) ×2 IMPLANT
SET BERKELEY SUCTION TUBING (SUCTIONS) ×2 IMPLANT
SYR CONTROL 10ML LL (SYRINGE) ×2 IMPLANT
TOWEL OR 17X24 6PK STRL BLUE (TOWEL DISPOSABLE) ×4 IMPLANT
VACURETTE 10 RIGID CVD (CANNULA) IMPLANT
VACURETTE 14MM CVD 1/2 BASE (CANNULA) IMPLANT
VACURETTE 7MM CVD STRL WRAP (CANNULA) IMPLANT
VACURETTE 8 RIGID CVD (CANNULA) ×2 IMPLANT
VACURETTE 9 RIGID CVD (CANNULA) ×2 IMPLANT

## 2011-11-22 NOTE — Brief Op Note (Signed)
11/22/2011  3:23 PM  PATIENT:  Katherine Mitchell  36 y.o. female  PRE-OPERATIVE DIAGNOSIS:  postpartum bleed, endometritis, anemia  POST-OPERATIVE DIAGNOSIS:  postpartum bleed., endometritis, anemia  PROCEDURE:  Procedure(s) (LRB): DILATATION AND EVACUATION (N/A), u/s guided  SURGEON:  Surgeon(s) and Role:    * Naven Giambalvo A. Pamala Hurry, MD - Primary  PHYSICIAN ASSISTANT: noen  ASSISTANTS: none   ANESTHESIA:   paracervical block and MAC  EBL:  Total I/O In: 1300 [I.V.:1300] Out: 300 [Blood:300]  BLOOD ADMINISTERED:1 unit CC PRBC  DRAINS: none   LOCAL MEDICATIONS USED:  LIDOCAINE  and Amount: 30 ml. 1% w/ 1:100 epi,   SPECIMEN:  Source of Specimen:  uterine contents, r/o retained POCs  DISPOSITION OF SPECIMEN:  PATHOLOGY  COUNTS:  YES  TOURNIQUET:  * No tourniquets in log *  DICTATION: .Note written in EPIC  PLAN OF CARE: Admit to inpatient   PATIENT DISPOSITION:  PACU - hemodynamically stable.   Delay start of Pharmacological VTE agent (>24hrs) due to surgical blood loss or risk of bleeding: yes

## 2011-11-22 NOTE — MAU Note (Signed)
Spoke with IP, Mickel Baas, and she states, pt should remain on isolation due to increase incidents of norovirus.  Pt had fever and vomiting today and was placed on precautions.  Fever, suspected due to POC, pt going to OR

## 2011-11-22 NOTE — MAU Note (Signed)
Pt to OR by stretcher, blood transfusion in process, VS stable.

## 2011-11-22 NOTE — Transfer of Care (Signed)
Immediate Anesthesia Transfer of Care Note  Patient: Katherine Mitchell  Procedure(s) Performed: Procedure(s) (LRB): DILATATION AND EVACUATION (N/A)  Patient Location: PACU  Anesthesia Type: MAC  Level of Consciousness: awake  Airway & Oxygen Therapy: Patient Spontanous Breathing  Post-op Assessment: Report given to PACU RN and Patient moving all extremities  Post vital signs: Reviewed and stable  Complications: No apparent anesthesia complications

## 2011-11-22 NOTE — MAU Note (Signed)
Pt has IV in place, 20 g. L antecubital, NS infusing.

## 2011-11-22 NOTE — MAU Note (Signed)
Assisted patient with breast pump, will label and store breast milk in NICU while pt in OR.   PACU notified and report given that breast milk will be stored in NICU and will need to retrieve when Patient is discharged

## 2011-11-22 NOTE — Anesthesia Preprocedure Evaluation (Signed)
Anesthesia Evaluation  Patient identified by MRN, date of birth, ID band  Reviewed: Allergy & Precautions, H&P , NPO status , Patient's Chart, lab work & pertinent test results  Airway Mallampati: II TM Distance: >3 FB Neck ROM: full    Dental No notable dental hx. (+) Teeth Intact   Pulmonary neg pulmonary ROS,    Pulmonary exam normal       Cardiovascular negative cardio ROS      Neuro/Psych negative neurological ROS  negative psych ROS   GI/Hepatic Neg liver ROS,   Endo/Other    Renal/GU negative Renal ROS  negative genitourinary   Musculoskeletal negative musculoskeletal ROS (+)   Abdominal Normal abdominal exam  (+)   Peds negative pediatric ROS (+)  Hematology negative hematology ROS (+)   Anesthesia Other Findings   Reproductive/Obstetrics negative OB ROS                           Anesthesia Physical Anesthesia Plan  ASA: II  Anesthesia Plan: MAC   Post-op Pain Management:    Induction: Intravenous  Airway Management Planned:   Additional Equipment:   Intra-op Plan:   Post-operative Plan:   Informed Consent: I have reviewed the patients History and Physical, chart, labs and discussed the procedure including the risks, benefits and alternatives for the proposed anesthesia with the patient or authorized representative who has indicated his/her understanding and acceptance.     Plan Discussed with: CRNA and Surgeon  Anesthesia Plan Comments:         Anesthesia Quick Evaluation

## 2011-11-22 NOTE — Op Note (Signed)
PATIENT:  Katherine Mitchell  36 y.o. female  PRE-OPERATIVE DIAGNOSIS:  postpartum bleed, endometritis, anemia  POST-OPERATIVE DIAGNOSIS:  postpartum bleed., endometritis, anemia  PROCEDURE:  Procedure(s) (LRB): DILATATION AND EVACUATION (N/A), u/s guided  SURGEON:  Surgeon(s) and Role:    * Jinelle Butchko A. Pamala Hurry, MD - Primary  PHYSICIAN ASSISTANT: noen  ASSISTANTS: none   ANESTHESIA:   paracervical block and MAC  EBL:  Total I/O In: 1300 [I.V.:1300] Out: 300 [Blood:300]  BLOOD ADMINISTERED:1 unit CC PRBC  DRAINS: none   LOCAL MEDICATIONS USED:  LIDOCAINE  and Amount: 30 ml. 1% w/ 1:100 epi,   SPECIMEN:  Source of Specimen:  uterine contents, r/o retained POCs  DISPOSITION OF SPECIMEN:  PATHOLOGY  COUNTS:  YES  TOURNIQUET:  * No tourniquets in log *  DICTATION: .Note written in EPIC  PLAN OF CARE: Admit to inpatient   PATIENT DISPOSITION:  PACU - hemodynamically stable.   Delay start of Pharmacological VTE agent (>24hrs) due to surgical blood loss or risk of bleeding: yes Abx: 2 g cefoxitin EBL: 300 cc blood/ clot in uterus Complications: none  Indications: This is a patient 11 days postpartum from a vaginal birth after cesarean delivery who presents with a fever symptomatic anemia and enlarged uterus filled with heterogeneous material. Concern was for retained POC's versus clot in the uterus contributing to endometritis. After one unit of blood and 2 g of cefoxitin the decision was made to take patient to the operating room for an ultrasound-guided suction D&C.  Procedure: After informed consent the patient was taken to the operating room where mask anesthesia was initiated without difficulty she is prepped and draped in the normal sterile fashion in dorsal supine lithotomy position. A bimanual examination was done which confirmed an enlarged uterus. Preoperatively this uterus was also found to be tender on abdominal exam. The cervix was fingertip dilated. No bladder  catheterization was done to allow for improved visualization by ultrasound. A speculum.  was placed into the vagina single-tooth tenaculum used to grasp the anterior lip of the cervix and the cervix the cervicovaginal junction was infused with 30 cc of 1% lidocaine with 1 200,000 units of epinephrine. Using ultrasound guidance a #8 suction curet was passed through the internal os and suction was created to moderate amount of blood and clot was removed. Several passes were made and the suction canister needed to be changed 3 times throughout the procedure to the due to the large amount of clot. Given the large amount of material in the uterus we changed during the case to a #9 suction curet this aided in removal. We also needed to use fundal pressure to help bring the boggy uterus closer into the pelvis. At the conclusion of the case the endometrial stripe was noted to be 4 mm and no flow was noted into the endometrial cavity there was a 3 x 3 cm area of heterogeneous echogenicity posterior to the endometrial stripe with a small amount of flow this was thought to be secondary to an intramural fibroid. The uterine dimensions were 14 x 5 cm at the end of the case and bleeding was well-controlled. The patient tolerated the procedure well sponge lap and needle counts were correct x3 the patient was transferred to the recovery room with plans for hospital admission for an additional 2 units of packed red cells continued antibiotics and Methergine     Katherine Mitchell A. 11/22/2011 3:34 PM

## 2011-11-22 NOTE — H&P (Addendum)
Katherine Mitchell is an 36 y.o. female. Here for postpartum hemorrhage, dizziness, fever, abdo pain and vomiting when stood up in shower. Has been bleeding for few days, had called office and didn't need evaluation since she got better. Now came with Paramedics with blood running out, dizzy, weak and vomiting. Feels a bit better since 1 lit IVF and in bed.  Uncomplicated delivery (VBAC) of baby and placenta. Went home with routine discharge and slight anemia.  Stable Ulcerative colitis with no recent flare. Prior blood transfusion for ulcerative colitis. Agrees to transfusion if needed.    Past Medical History  Diagnosis Date  . Pregnancy with other poor obstetric history   . Previous cesarean delivery, unspecified as to episode of care or not applicable   . Ulcerative colitis, unspecified   . Leiomyoma of uterus, unspecified   . Other venous complication of pregnancy and the puerperium, unspecified as to episode of care     hemorrhoids  . Proteinuria   . Anemia   . History of maternal blood transfusion, currently pregnant   . Gestational diabetes     glyburide  . PP care - s/p VBAC 3/4 11/12/2011  . Maternal anemia, with delivery 11/14/2011    Past Surgical History  Procedure Date  . Cesarean section   . Wisdom tooth extraction   . Hernia repair     umbilical  . Sigmoidoscopy     Family History  Problem Relation Age of Onset  . Diabetes Mother     Social History:  reports that she has never smoked. She has never used smokeless tobacco. She reports that she does not drink alcohol or use illicit drugs.  Allergies: No Known Allergies  Prescriptions prior to admission  Medication Sig Dispense Refill  . Fe Cbn-Fe Gluc-FA-B12-C-DSS (FERRALET 90) 90-1 MG TABS Take 1 tablet by mouth daily. Take on empty stomach between meals with cup of orange juice or Vit. C 250 mg. Do not take with dairy products or calcium.  30 each  3  . ibuprofen (ADVIL,MOTRIN) 600 MG tablet Take 1 tablet (600  mg total) by mouth every 6 (six) hours.  60 tablet  0  . Prenatal Vit-Fe Fumarate-FA (PRENATAL MULTIVITAMIN) TABS Take 1 tablet by mouth daily.        Review of Systems  Constitutional: Positive for fever.  Eyes: Negative for blurred vision.  Neurological: Positive for dizziness.    Blood pressure 99/59, pulse 103, temperature 100.1 F (37.8 C), temperature source Oral, resp. rate 20, currently breastfeeding. Physical Exam A&O x 3, no acute distress. Pleasant HEENT neg, no thyromegaly Lungs CTA bilat CV Tachycardia + Abdo Uterus tender, 16 wks.  Extr no edema/ tenderness Pelvic   Results for orders placed during the hospital encounter of 11/22/11 (from the past 24 hour(s))  CBC     Status: Abnormal   Collection Time   11/22/11  9:20 AM      Component Value Range   WBC 18.1 (*) 4.0 - 10.5 (K/uL)   RBC 2.85 (*) 3.87 - 5.11 (MIL/uL)   Hemoglobin 7.6 (*) 12.0 - 15.0 (g/dL)   HCT 23.8 (*) 36.0 - 46.0 (%)   MCV 83.5  78.0 - 100.0 (fL)   MCH 26.7  26.0 - 34.0 (pg)   MCHC 31.9  30.0 - 36.0 (g/dL)   RDW 16.8 (*) 11.5 - 15.5 (%)   Platelets 206  150 - 400 (K/uL)  DIFFERENTIAL     Status: Abnormal   Collection Time  11/22/11  9:20 AM      Component Value Range   Neutrophils Relative 91 (*) 43 - 77 (%)   Neutro Abs 16.6 (*) 1.7 - 7.7 (K/uL)   Lymphocytes Relative 4 (*) 12 - 46 (%)   Lymphs Abs 0.8  0.7 - 4.0 (K/uL)   Monocytes Relative 4  3 - 12 (%)   Monocytes Absolute 0.8  0.1 - 1.0 (K/uL)   Eosinophils Relative 0  0 - 5 (%)   Eosinophils Absolute 0.0  0.0 - 0.7 (K/uL)   Basophils Relative 0  0 - 1 (%)   Basophils Absolute 0.0  0.0 - 0.1 (K/uL)  COMPREHENSIVE METABOLIC PANEL     Status: Abnormal   Collection Time   11/22/11  9:20 AM      Component Value Range   Sodium 136  135 - 145 (mEq/L)   Potassium 3.6  3.5 - 5.1 (mEq/L)   Chloride 107  96 - 112 (mEq/L)   CO2 21  19 - 32 (mEq/L)   Glucose, Bld 94  70 - 99 (mg/dL)   BUN 14  6 - 23 (mg/dL)   Creatinine, Ser 0.55  0.50  - 1.10 (mg/dL)   Calcium 8.5  8.4 - 10.5 (mg/dL)   Total Protein 5.3 (*) 6.0 - 8.3 (g/dL)   Albumin 2.5 (*) 3.5 - 5.2 (g/dL)   AST 14  0 - 37 (U/L)   ALT 17  0 - 35 (U/L)   Alkaline Phosphatase 86  39 - 117 (U/L)   Total Bilirubin 0.4  0.3 - 1.2 (mg/dL)   GFR calc non Af Amer >90  >90 (mL/min)   GFR calc Af Amer >90  >90 (mL/min)    US Pelvis Complete  11/22/2011  *RADIOLOGY REPORT*  Clinical Data: Post vaginal delivery on 11/12/2011 with fever and heavy bleeding.  Question retained products of conception  TRANSABDOMINAL ULTRASOUND OF PELVIS  Technique:  Transabdominal ultrasound examination of the pelvis was performed including evaluation of the uterus, ovaries, adnexal regions, and pelvic cul-de-sac.  Comparison:  None.  Findings:  Uterus:  Is not in the large compatible with postpartum status measuring approximately 16 cm in length, 6.7 cm in depth and 12.4 cm in width.  Endometrium: Is distended with heterogeneous material with a depth of 4.8 cm , width of 6 cm and extending along the length of the endometrial canal.  Color Doppler reveals no evidence for flow within the heterogeneous material.  Right ovary: Has a normal appearance measuring 3.0 x 3.5 by 3.3 cm  Left ovary: Has a normal appearance measuring 3.1 x 3.3 by 1.7 cm  Other Findings:  No pelvic fluid or separate adnexal masses are noted.  IMPRESSION: Distended endometrial canal containing heterogeneous avascular soft tissue. While this cannot be confirmed to represent retained products due to the lack of internal flow, the large amount of material would make it suspicious for retained products of conception even with the lack of associated vascularity. A large amount of retained clot could give a similar appearance.  Original Report Authenticated By: Ander Gaster, M.D.    Assessment/Plan: Retained POCs with puerperal fever, postpartum hemorrhage and symptomatic anemia.  Plan Cefoxitin, 1 pRBCs transfusion, IVFluids (LR). NPO,  consent for surgery and blood transfusion.  Risks/complications reviewed, understands and agrees.   MODY,VAISHALI R 11/22/2011, 11:20 AM  Met w/ pt to review H&P. In short, VBAC on 3/3 with routine PP course. Pt does note bleeding off and on since delivery, slightly heavier  than she anticipated and clots in waves every few days. On Tues pt notes a very heavy bleeding episiode changing pads every 30 min for a few hours. Since Tues bleeding has overall been heavier. Starting last night, bleeding again got heavier and pt notes fever and lethargy as well as dizziness this am.   NKDA PMH: UC, fibroids PSH: c/s, colonoscopy, pt denies h/o surgery related to UC SH: married, breasfeeding newborn, also 36 yr old at home  PE: Awake, no distress CV: RRR, no tachy, no murmur Pulm: CTAB Abd: fundal tenderness noted, abd non-distended, no guarding, non-surgical GU: def to OR  U/s: 4x6cm heterogenous mass filling cavity, no flow  WBC 18, Hgb 7  A/P: PPH - acute blood loss anemia. Will tx 2 units pRBC - PPH w/ retained POC's, proceed w/ u/s guided D&C. R./B d/w pt including perforation of uterus and subsequent complications. - endometritis, need to move ahead w/ D&C, cont abx for 24 hrs then consider d/c as long as remains AF.  Tauri Ethington A. 11/22/2011 2:10 PM

## 2011-11-22 NOTE — MAU Note (Signed)
Pt arrived by EMS, had vag delivery on March 4, had moderate bleeding after delivery, became very heavy over last 3 days with large blood clots.  Pt had fever & vomitting that started this a.m., no diarrhea.

## 2011-11-22 NOTE — Progress Notes (Signed)
Dr. Benjie Karvonen in surgery, informed her of pt status, see verbal orders.

## 2011-11-22 NOTE — Anesthesia Postprocedure Evaluation (Signed)
Anesthesia Post Note  Patient: Katherine Mitchell  Procedure(s) Performed: Procedure(s) (LRB): DILATATION AND EVACUATION (N/A)  Anesthesia type: MAC  Patient location: PACU  Post pain: Pain level controlled  Post assessment: Post-op Vital signs reviewed  Last Vitals:  Filed Vitals:   11/22/11 1522  BP:   Pulse: 95  Temp: 36.8 C  Resp: 19    Post vital signs: Reviewed  Level of consciousness: sedated  Complications: No apparent anesthesia complications

## 2011-11-23 LAB — CBC
Platelets: 187 10*3/uL (ref 150–400)
RBC: 3.24 MIL/uL — ABNORMAL LOW (ref 3.87–5.11)
WBC: 13 10*3/uL — ABNORMAL HIGH (ref 4.0–10.5)

## 2011-11-23 LAB — TYPE AND SCREEN
ABO/RH(D): O POS
Antibody Screen: NEGATIVE
Unit division: 0

## 2011-11-23 MED ORDER — METHYLERGONOVINE MALEATE 0.2 MG PO TABS
0.2000 mg | ORAL_TABLET | Freq: Three times a day (TID) | ORAL | Status: DC
  Administered 2011-11-23 – 2011-11-24 (×5): 0.2 mg via ORAL
  Filled 2011-11-23 (×5): qty 1

## 2011-11-23 MED ORDER — POLYETHYLENE GLYCOL 3350 17 G PO PACK
17.0000 g | PACK | Freq: Every day | ORAL | Status: DC
  Administered 2011-11-23 – 2011-11-24 (×2): 17 g via ORAL
  Filled 2011-11-23 (×3): qty 1

## 2011-11-23 NOTE — Progress Notes (Signed)
POD # 1 s/p D&C for retained clot, PPH, endometritis  S: pt notes no longer w/ fever, no HA, no dizziness upon ambulating this am. Voiding w/o pain and pt notes no persistent abdominal pain. Tol po last pm, hungry and waiting on breakfast this am. No BM. Light bleeding. Pt has been pumping. S/p 3 units pRBC  O:  Filed Vitals:   11/22/11 2000 11/22/11 2047 11/23/11 0123 11/23/11 0613  BP: 96/61 103/71 122/68 96/62  Pulse: 99 91 86 88  Temp: 99 F (37.2 C) 99.5 F (37.5 C) 98.4 F (36.9 C) 97.8 F (36.6 C)  TempSrc: Oral Oral Oral   Resp: 16 20 18 20   SpO2: 98%  98% 97%   Gen: well, no distress CV: RRR Pulm: CTAB Abd: no RUQ pain, fundal tenderness present, no rebound, no guarding LE: SCD's in place, no edema GU: def  CBC    Component Value Date/Time   WBC 13.0* 11/23/2011 0540   RBC 3.24* 11/23/2011 0540   HGB 9.0* 11/23/2011 0540   HCT 27.0* 11/23/2011 0540   PLT 187 11/23/2011 0540   MCV 83.3 11/23/2011 0540   MCH 27.8 11/23/2011 0540   MCHC 33.3 11/23/2011 0540   RDW 16.3* 11/23/2011 0540   LYMPHSABS 0.8 11/22/2011 0920   MONOABS 0.8 11/22/2011 0920   EOSABS 0.0 11/22/2011 0920   BASOSABS 0.0 11/22/2011 0920    A/P: POD # 1 s/p D&C for uterine clot, r/o retained POCs - post-op. Recovering well, cont routine post-op care - PPH. Hgb improved s/o pRBC. Will cont methergine for 6 doses, transition to po. Breastfeeding, pumping encouraged. Home with iron supplements - endometritis. Still with fundal tenderness, though fever and leukocytosis resolving. Will cont IV Abx, re-eval in am - Dispo. Likely home tomorrow if fundal tenderness improves Laporsha Grealish A. 11/23/2011 9:32 AM

## 2011-11-23 NOTE — Progress Notes (Signed)
UR Chart review completed.  

## 2011-11-24 MED ORDER — AMOXICILLIN-POT CLAVULANATE 875-125 MG PO TABS
1.0000 | ORAL_TABLET | Freq: Once | ORAL | Status: AC
  Administered 2011-11-24: 1 via ORAL
  Filled 2011-11-24: qty 1

## 2011-11-24 MED ORDER — METHYLERGONOVINE MALEATE 0.2 MG PO TABS: 0.2000 mg | ORAL_TABLET | Freq: Four times a day (QID) | ORAL | Status: DC

## 2011-11-24 MED ORDER — POLYETHYLENE GLYCOL 3350 17 G PO PACK: 17.0000 g | PACK | Freq: Every day | ORAL | Status: AC

## 2011-11-24 MED ORDER — AMOXICILLIN-POT CLAVULANATE 875-125 MG PO TABS: 1.0000 | ORAL_TABLET | Freq: Two times a day (BID) | ORAL | Status: AC

## 2011-11-24 NOTE — Discharge Instructions (Signed)
Call if temperature greater than equal to 100.4, nothing per vagina for 4-6 weeks or severe nausea vomiting,  showers no bath °

## 2011-11-24 NOTE — Progress Notes (Signed)
S: no complaints. Feels better. Ready for home  O: VSS Afebrile Lungs clear to A Cor RRR w/o murmur Abd: uterus tender( mild) Pad scant blood  IMP: PP endometritis 2nd to retained clots postpartum PPH  2nd to uterine atony s/p blood transfusion Anemia 2nd to blood loss  P) d/c home. augmentin 875 mg po bid x 10 days. F/u 2 wk. D/c instructions reviewed. Script for methergine, augmentin, miralax given cont iron at home and cont motrin at home

## 2011-11-24 NOTE — Progress Notes (Signed)
D/C instructions reviewed with pt.  Pt states understanding of home care.  No home equipment needed.  Ambulated to car with staff without incident.  D/C'd home with family.

## 2011-11-27 ENCOUNTER — Encounter (HOSPITAL_COMMUNITY): Payer: Self-pay | Admitting: Obstetrics

## 2012-05-21 LAB — OB RESULTS CONSOLE HEPATITIS B SURFACE ANTIGEN: Hepatitis B Surface Ag: NEGATIVE

## 2012-05-21 LAB — OB RESULTS CONSOLE RUBELLA ANTIBODY, IGM: Rubella: IMMUNE

## 2012-05-21 LAB — OB RESULTS CONSOLE RPR: RPR: NONREACTIVE

## 2012-08-15 ENCOUNTER — Other Ambulatory Visit: Payer: Self-pay | Admitting: Obstetrics and Gynecology

## 2012-08-15 ENCOUNTER — Ambulatory Visit (INDEPENDENT_AMBULATORY_CARE_PROVIDER_SITE_OTHER): Payer: Medicaid Other | Admitting: Obstetrics and Gynecology

## 2012-08-15 ENCOUNTER — Encounter: Payer: Self-pay | Admitting: Obstetrics and Gynecology

## 2012-08-15 DIAGNOSIS — Z331 Pregnant state, incidental: Secondary | ICD-10-CM

## 2012-08-15 NOTE — Progress Notes (Signed)
Test strips for One Touch Ultra Mini called #100, use 4 x a day as directed, with RF through March 2014 called to [facility], [place]. If insurance does not cover ordered whatever monitor is covered by insurance and corresponding test strips.  Pt made aware.

## 2012-08-15 NOTE — Progress Notes (Signed)
NOB Interview. Transferring from [place] with records. Pt diagnosed with GDM.  Taking Glyberide. Monitoring diet.  Advised to monitor blood sugars and bring to [place]> Requested RF test strips. History reviewed with DR AR. Plan approved.

## 2012-08-17 ENCOUNTER — Encounter: Payer: Self-pay | Admitting: Obstetrics and Gynecology

## 2012-08-17 DIAGNOSIS — O34219 Maternal care for unspecified type scar from previous cesarean delivery: Secondary | ICD-10-CM | POA: Insufficient documentation

## 2012-08-17 DIAGNOSIS — O09529 Supervision of elderly multigravida, unspecified trimester: Secondary | ICD-10-CM

## 2012-08-17 DIAGNOSIS — K5190 Ulcerative colitis, unspecified, without complications: Secondary | ICD-10-CM | POA: Insufficient documentation

## 2012-08-17 HISTORY — DX: Supervision of elderly multigravida, unspecified trimester: O09.529

## 2012-08-18 ENCOUNTER — Encounter: Payer: Self-pay | Admitting: Obstetrics and Gynecology

## 2012-08-18 NOTE — Progress Notes (Signed)
Fax received fro RX for monitor and strips.  Rx faxed.

## 2012-08-20 ENCOUNTER — Ambulatory Visit (INDEPENDENT_AMBULATORY_CARE_PROVIDER_SITE_OTHER): Payer: Medicaid Other | Admitting: Obstetrics and Gynecology

## 2012-08-20 VITALS — BP 100/56 | Wt 200.0 lb

## 2012-08-20 DIAGNOSIS — Z331 Pregnant state, incidental: Secondary | ICD-10-CM

## 2012-08-20 DIAGNOSIS — O99810 Abnormal glucose complicating pregnancy: Secondary | ICD-10-CM

## 2012-08-20 DIAGNOSIS — O24419 Gestational diabetes mellitus in pregnancy, unspecified control: Secondary | ICD-10-CM

## 2012-08-20 MED ORDER — GLYBURIDE 2.5 MG PO TABS: 2.5000 mg | ORAL_TABLET | Freq: Two times a day (BID) | ORAL | Status: DC

## 2012-08-20 NOTE — Progress Notes (Signed)
   Katherine Mitchell is being seen today for her first obstetrical visit at 13w6dgestation by early [place]and LMP.  [facility] but no records are present in [facility] except from the patient's previous 2013 pregnancy.  Ulcerative colitis stable.  She reports doing well, checking CBGs FBS and 2 hour pp, with values WNL.  She just changed meters due to insurance issues and just got test strips.  Needs refill on Glyburide 2.5 mg po BID.  Plans VBAC--had fairly rapid labor with VBAC in March.  Transferred due to insurance issues--was followed by single MD last pregnancy, wants to make sure providers are familiar with her as pregnancy progresses.  Had retained POC after last delivery, with hemorrhage and need for blood transfusion 10 days pp.  OK with CNM delivery.  Her obstetrical history is significant for: Patient Active Problem List  Diagnosis  . GDM, class A2  . Maternal anemia, with delivery  . Hx Puerperal fever  . Postpartum hemorrhage, delayed (> 24 hrs)  . Previous cesarean delivery affecting pregnancy  . AMA (advanced maternal age) multigravida 392+ . Ulcerative colitis    Relationship with FOB:  Katherine Mitchell, involved and supportive She is not employed.   Feeding plan:   Breast  Pregnancy history fully reviewed.  The following portions of the patient's history were reviewed and updated as appropriate: allergies, current medications, past family history, past medical history, past social history, past surgical history and problem list.  Review of Systems Pertinent ROS is described in HPI   Objective:   BP 100/56  Wt 200 lb (90.719 kg)  LMP 02/28/2012  Breastfeeding? Yes Wt Readings from Last 1 Encounters:  08/20/12 200 lb (90.719 kg)   BMI: There is no height on file to calculate BMI.  General: alert, cooperative and no distress HEENT: grossly normal  Thyroid: normal  Respiratory: clear to auscultation  bilaterally Cardiovascular: regular rate and rhythm,  Breasts:  No dominant masses, nipples erect Gastrointestinal: soft, non-tender; no masses,  no organomegaly Extremities: extremities normal, no pain or edema Vaginal Bleeding: None  EXTERNAL GENITALIA: normal appearing vulva with no masses, tenderness or lesions VAGINA: no abnormal discharge or lesions CERVIX: no lesions or cervical motion tenderness; cervix closed, long, firm UTERUS: gravid and consistent with 25 weeks ADNEXA: no masses palpable and nontender OB EXAM PELVIMETRY: appears adequate, proven to 8+6  FHR:  150  bpm  Assessment:    Pregnancy at  24 6/7 weeks [facility] Previous [facility]/S, with subsequent VBAC--desires another VBAC Gestational diabetes, on Glyburide AMA Hx PP hemorrhage Plan:     Prenatal panel reviewed and discussed with the patient:  From previous pregnancy  Pap smear collected:  At previous office GC/Chlamydia collected:  At previous office Wet prep:  NA Discussion of Genetic testing options:  Unsure--no previous records. Prenatal vitamins recommended  Plan of care: Next visit:  2 weeks for CBG review [place]for growth and fluid due to gestational diabetes, on Glyburide Other anticipated f/u:    [place]for growth and fluid q 4 weeks   VDonnel Saxon CNM

## 2012-08-20 NOTE — Progress Notes (Signed)
24w6dPulse=100. Pt voices no complaints.

## 2012-08-27 ENCOUNTER — Encounter: Payer: Self-pay | Admitting: Obstetrics and Gynecology

## 2012-08-27 DIAGNOSIS — IMO0002 Reserved for concepts with insufficient information to code with codable children: Secondary | ICD-10-CM

## 2012-08-27 HISTORY — DX: Reserved for concepts with insufficient information to code with codable children: IMO0002

## 2012-09-01 ENCOUNTER — Other Ambulatory Visit: Payer: Self-pay | Admitting: Obstetrics and Gynecology

## 2012-09-01 ENCOUNTER — Ambulatory Visit (INDEPENDENT_AMBULATORY_CARE_PROVIDER_SITE_OTHER): Payer: Medicaid Other

## 2012-09-01 ENCOUNTER — Ambulatory Visit (INDEPENDENT_AMBULATORY_CARE_PROVIDER_SITE_OTHER): Payer: Medicaid Other | Admitting: Obstetrics and Gynecology

## 2012-09-01 ENCOUNTER — Encounter: Payer: Self-pay | Admitting: Obstetrics and Gynecology

## 2012-09-01 VITALS — BP 98/66 | Wt 201.0 lb

## 2012-09-01 DIAGNOSIS — O24419 Gestational diabetes mellitus in pregnancy, unspecified control: Secondary | ICD-10-CM

## 2012-09-01 DIAGNOSIS — O99810 Abnormal glucose complicating pregnancy: Secondary | ICD-10-CM

## 2012-09-01 NOTE — Patient Instructions (Signed)
Fetal Movement Counts Patient Name: __________________________________________________ Patient Due Date: ____________________ Marquis Lunch counts is highly recommended in high risk pregnancies, but it is a good idea for every pregnant woman to do. Start counting fetal movements at 28 weeks of the pregnancy. Fetal movements increase after eating a full meal or eating or drinking something sweet (the blood sugar is higher). It is also important to drink plenty of fluids (well hydrated) before doing the count. Lie on your left side because it helps with the circulation or you can sit in a comfortable chair with your arms over your belly (abdomen) with no distractions around you. DOING THE COUNT  Try to do the count the same time of day each time you do it.  Mark the day and time, then see how long it takes for you to feel 10 movements (kicks, flutters, swishes, rolls). You should have at least 10 movements within 2 hours. You will most likely feel 10 movements in much less than 2 hours. If you do not, wait an hour and count again. After a couple of days you will see a pattern.  What you are looking for is a change in the pattern or not enough counts in 2 hours. Is it taking longer in time to reach 10 movements? SEEK MEDICAL CARE IF:  You feel less than 10 counts in 2 hours. Tried twice.  No movement in one hour.  The pattern is changing or taking longer each day to reach 10 counts in 2 hours.  You feel the baby is not moving as it usually does. Date: ____________ Movements: ____________ Start time: ____________ Elizebeth Koller time: ____________  Date: ____________ Movements: ____________ Start time: ____________ Elizebeth Koller time: ____________ Date: ____________ Movements: ____________ Start time: ____________ Elizebeth Koller time: ____________ Date: ____________ Movements: ____________ Start time: ____________ Elizebeth Koller time: ____________ Date: ____________ Movements: ____________ Start time: ____________ Elizebeth Koller time:  ____________ Date: ____________ Movements: ____________ Start time: ____________ Elizebeth Koller time: ____________ Date: ____________ Movements: ____________ Start time: ____________ Elizebeth Koller time: ____________ Date: ____________ Movements: ____________ Start time: ____________ Elizebeth Koller time: ____________  Date: ____________ Movements: ____________ Start time: ____________ Elizebeth Koller time: ____________ Date: ____________ Movements: ____________ Start time: ____________ Elizebeth Koller time: ____________ Date: ____________ Movements: ____________ Start time: ____________ Elizebeth Koller time: ____________ Date: ____________ Movements: ____________ Start time: ____________ Elizebeth Koller time: ____________ Date: ____________ Movements: ____________ Start time: ____________ Elizebeth Koller time: ____________ Date: ____________ Movements: ____________ Start time: ____________ Elizebeth Koller time: ____________ Date: ____________ Movements: ____________ Start time: ____________ Elizebeth Koller time: ____________  Date: ____________ Movements: ____________ Start time: ____________ Elizebeth Koller time: ____________ Date: ____________ Movements: ____________ Start time: ____________ Elizebeth Koller time: ____________ Date: ____________ Movements: ____________ Start time: ____________ Elizebeth Koller time: ____________ Date: ____________ Movements: ____________ Start time: ____________ Elizebeth Koller time: ____________ Date: ____________ Movements: ____________ Start time: ____________ Elizebeth Koller time: ____________ Date: ____________ Movements: ____________ Start time: ____________ Elizebeth Koller time: ____________ Date: ____________ Movements: ____________ Start time: ____________ Elizebeth Koller time: ____________  Date: ____________ Movements: ____________ Start time: ____________ Elizebeth Koller time: ____________ Date: ____________ Movements: ____________ Start time: ____________ Elizebeth Koller time: ____________ Date: ____________ Movements: ____________ Start time: ____________ Elizebeth Koller time: ____________ Date: ____________ Movements:  ____________ Start time: ____________ Elizebeth Koller time: ____________ Date: ____________ Movements: ____________ Start time: ____________ Elizebeth Koller time: ____________ Date: ____________ Movements: ____________ Start time: ____________ Elizebeth Koller time: ____________ Date: ____________ Movements: ____________ Start time: ____________ Elizebeth Koller time: ____________  Date: ____________ Movements: ____________ Start time: ____________ Elizebeth Koller time: ____________ Date: ____________ Movements: ____________ Start time: ____________ Elizebeth Koller time: ____________ Date: ____________ Movements: ____________ Start time: ____________ Elizebeth Koller time: ____________ Date: ____________ Movements:  ____________ Start time: ____________ Elizebeth Koller time: ____________ Date: ____________ Movements: ____________ Start time: ____________ Elizebeth Koller time: ____________ Date: ____________ Movements: ____________ Start time: ____________ Elizebeth Koller time: ____________ Date: ____________ Movements: ____________ Start time: ____________ Elizebeth Koller time: ____________  Date: ____________ Movements: ____________ Start time: ____________ Elizebeth Koller time: ____________ Date: ____________ Movements: ____________ Start time: ____________ Elizebeth Koller time: ____________ Date: ____________ Movements: ____________ Start time: ____________ Elizebeth Koller time: ____________ Date: ____________ Movements: ____________ Start time: ____________ Elizebeth Koller time: ____________ Date: ____________ Movements: ____________ Start time: ____________ Elizebeth Koller time: ____________ Date: ____________ Movements: ____________ Start time: ____________ Elizebeth Koller time: ____________ Date: ____________ Movements: ____________ Start time: ____________ Elizebeth Koller time: ____________  Date: ____________ Movements: ____________ Start time: ____________ Elizebeth Koller time: ____________ Date: ____________ Movements: ____________ Start time: ____________ Elizebeth Koller time: ____________ Date: ____________ Movements: ____________ Start time: ____________ Elizebeth Koller  time: ____________ Date: ____________ Movements: ____________ Start time: ____________ Elizebeth Koller time: ____________ Date: ____________ Movements: ____________ Start time: ____________ Elizebeth Koller time: ____________ Date: ____________ Movements: ____________ Start time: ____________ Elizebeth Koller time: ____________ Date: ____________ Movements: ____________ Start time: ____________ Elizebeth Koller time: ____________  Date: ____________ Movements: ____________ Start time: ____________ Elizebeth Koller time: ____________ Date: ____________ Movements: ____________ Start time: ____________ Elizebeth Koller time: ____________ Date: ____________ Movements: ____________ Start time: ____________ Elizebeth Koller time: ____________ Date: ____________ Movements: ____________ Start time: ____________ Elizebeth Koller time: ____________ Date: ____________ Movements: ____________ Start time: ____________ Elizebeth Koller time: ____________ Date: ____________ Movements: ____________ Start time: ____________ Elizebeth Koller time: ____________ Document Released: 09/26/2006 Document Revised: 11/19/2011 Document Reviewed: 03/29/2009 Exit[employer] Patient Information 2013 [place].

## 2012-09-01 NOTE — Progress Notes (Signed)
Pt w/o complaint today, states she has already been given flu shot.

## 2012-09-01 NOTE — Progress Notes (Signed)
57w4dUKoreaEFW 2- 12 >97 % cx 3.78 cm AFI 24.04 cm vtx presetntation FBS 78-124 2hr pp 103-148 Macrosomia and polyhydramnios in DM Increase glyburide to 521mBID Reviewed diabetic diet FKC RT 2 weeks US[place]oreaor growth in 4 weeks

## 2012-09-05 LAB — US OB FOLLOW UP

## 2012-09-10 HISTORY — DX: Maternal care for unspecified type scar from previous cesarean delivery: O34.219

## 2012-09-10 NOTE — L&D Delivery Note (Signed)
Delivery Note Cx complete at 1325.  Pushed great w/ 3-4 ctxs.  At 1:40 PM a viable female "Katherine Mitchell" was delivered via Vaginal, Spontaneous Delivery (Presentation: ; Occiput Anterior).  APGAR: 7, 9; weight 8 lb 15 oz (4055 g).  Head and body slow to deliver secondary to newborn's size, but no difficulty w/ shoulders.   Placenta status: Intact, Spontaneous, Schultz.  Cord blood collected for donation.  Cord: 3 vessels with the following complications: None.  Cord pH: not collected.  Anesthesia: Epidural  Episiotomy: None Lacerations: None Suture Repair: n/a Est. Blood Loss (mL): 90  Mom to postpartum.  Baby to nursery-stable. .. CBG (last 3)   Recent Labs  11/26/12 0839 11/26/12 1013 11/26/12 1155  GLUCAP 135* 130* 125*   glucosestabilizer started about an hr before delivery and pt received very small dose of insulin prior to delivery. Per c/w AVS, will continue Glyburide 57m po bid and continue cbg's daily fasting and 2hr PP after supper each day (bid). Plans to BF and declines BTL; s.o. Is planning vasectomy.  Madsen Riddle H 11/26/2012, 4:41 PM

## 2012-09-15 ENCOUNTER — Encounter: Payer: Self-pay | Admitting: Obstetrics and Gynecology

## 2012-09-15 ENCOUNTER — Ambulatory Visit (INDEPENDENT_AMBULATORY_CARE_PROVIDER_SITE_OTHER): Payer: Medicaid Other | Admitting: Obstetrics and Gynecology

## 2012-09-15 VITALS — BP 100/58 | Wt 202.0 lb

## 2012-09-15 DIAGNOSIS — O3421 Maternal care for scar from previous cesarean delivery: Secondary | ICD-10-CM

## 2012-09-15 DIAGNOSIS — O24419 Gestational diabetes mellitus in pregnancy, unspecified control: Secondary | ICD-10-CM

## 2012-09-15 DIAGNOSIS — O99810 Abnormal glucose complicating pregnancy: Secondary | ICD-10-CM

## 2012-09-15 DIAGNOSIS — O34219 Maternal care for unspecified type scar from previous cesarean delivery: Secondary | ICD-10-CM

## 2012-09-15 NOTE — Progress Notes (Signed)
22w4dU/S at [facility] secondary to DM on glyburide Start antepartum testing at 32wks RTO 1wks and in 2wks ROB with u/s for EFW secondary to DM FKCs Fasting CBGs are elevated except 2.  Pt will try to replicate what she did those nights and rto in 1wk.  If CBGs are still elevated, amy need to start insulin.  She is already on 516mglyburide in am and pm.

## 2012-09-22 ENCOUNTER — Ambulatory Visit (INDEPENDENT_AMBULATORY_CARE_PROVIDER_SITE_OTHER): Payer: Medicaid Other | Admitting: Obstetrics and Gynecology

## 2012-09-22 ENCOUNTER — Encounter: Payer: Self-pay | Admitting: Obstetrics and Gynecology

## 2012-09-22 VITALS — BP 100/60 | Wt 202.0 lb

## 2012-09-22 DIAGNOSIS — Z331 Pregnant state, incidental: Secondary | ICD-10-CM

## 2012-09-22 DIAGNOSIS — O24419 Gestational diabetes mellitus in pregnancy, unspecified control: Secondary | ICD-10-CM

## 2012-09-22 DIAGNOSIS — O99810 Abnormal glucose complicating pregnancy: Secondary | ICD-10-CM

## 2012-09-22 NOTE — Addendum Note (Signed)
Addended by: Marquette Old on: 09/22/2012 01:58 PM   Modules accepted: Orders

## 2012-09-22 NOTE — Progress Notes (Signed)
42w4dFasting blood sugars for the past week have all been greater than 100.  Two-hour p.c. Blood sugars ranged from 54-153.  She takes glyburide twice each day.  Insulin is recommended.  We will schedule an appointment with the diabetes counselor. Return to office in 1 week.  A growth ultrasound is scheduled. Dr. SRaphael Gibney

## 2012-09-24 ENCOUNTER — Encounter: Payer: Medicaid Other | Attending: Obstetrics and Gynecology | Admitting: Dietician

## 2012-09-24 VITALS — Ht 65.0 in | Wt 202.6 lb

## 2012-09-24 DIAGNOSIS — O24419 Gestational diabetes mellitus in pregnancy, unspecified control: Secondary | ICD-10-CM

## 2012-09-24 DIAGNOSIS — Z713 Dietary counseling and surveillance: Secondary | ICD-10-CM | POA: Insufficient documentation

## 2012-09-24 DIAGNOSIS — O99810 Abnormal glucose complicating pregnancy: Secondary | ICD-10-CM | POA: Insufficient documentation

## 2012-09-25 ENCOUNTER — Encounter: Payer: Medicaid Other | Admitting: Dietician

## 2012-09-25 ENCOUNTER — Ambulatory Visit (HOSPITAL_COMMUNITY)
Admission: RE | Admit: 2012-09-25 | Discharge: 2012-09-25 | Disposition: A | Discharge status: Home / Self Care | Payer: Medicaid Other | Source: Ambulatory Visit | Attending: Obstetrics and Gynecology | Admitting: Obstetrics and Gynecology

## 2012-09-25 ENCOUNTER — Encounter: Payer: Self-pay | Admitting: Dietician

## 2012-09-25 ENCOUNTER — Telehealth: Payer: Self-pay | Admitting: Obstetrics and Gynecology

## 2012-09-25 NOTE — Progress Notes (Signed)
  Patient was seen on 09/24/2012 for Gestational Diabetes self-management class at the [facility]. The following learning objectives were met by the patient during this course:   States the definition of Gestational Diabetes  States why dietary management is important in controlling blood glucose  Describes the effects each nutrient has on blood glucose levels  Demonstrates ability to create a balanced meal plan  Demonstrates carbohydrate counting   States when to check blood glucose levels  Demonstrates proper blood glucose monitoring techniques  States the effect of stress and exercise on blood glucose levels  States the importance of limiting caffeine and abstaining from alcohol and smoking  Blood glucose monitor given: Has her own glucose meter and has been monitoring her blood glucose levels.  Patient instructed to monitor glucose levels: FBS: 60 - <90 2 hour: <120  Patient received handouts:  Nutrition Diabetes and Pregnancy  Carbohydrate Counting List  Patient will be seen for follow-up as needed.  Patient notes that MD wanted her to have insulin instructions.  We do not have an order for this, but will call and obtain order and set up an appointment.  Maggie May

## 2012-09-25 NOTE — ED Notes (Signed)
Diabetes Education:  09/25/2012  Seen today for insulin instruction At GDM Class on 09/25/2011, Ms Warmack informed me that "Dr. Raphael Gibney wanted her to learn how to give herself insulin when she came to the GDM class."  At that time I did not have a written order with her referral for the class.  Obtained an order for insulin instructions from Dr. Crawford Givens on 09/25/2012.  Today, I was able to see her and teach her insulin instruction during my consulting time at the [facility].  Currently, the majority of her fasting blood glucose levels are 95-100 mg/dl or greater.  Her post-meal glucose levels are in the majority in the <120 mg/dl range.  She is taking Glyburide 5 mg in the AM before breakfast and in the PM before Dinner.  Recommended that she take the Glyburide at bedtime every night until she sees Dr.Stringer on 09/29/2012.  Try to limit her carbs to the 45-50 gm/dinner recommendation.   Completed insulin instruction using the single dose.  She was able to do a return demonstration using 10 units of sterile Saline and injecting into the RT thigh/femoral area.  Provided a BD insulin start kit.   She has my card and is to call with questions or issues. Maggie Raechell Singleton, RN, RD, CDE

## 2012-09-29 ENCOUNTER — Ambulatory Visit: Payer: Medicaid Other | Admitting: Obstetrics and Gynecology

## 2012-09-29 ENCOUNTER — Encounter: Payer: Self-pay | Admitting: Obstetrics and Gynecology

## 2012-09-29 ENCOUNTER — Ambulatory Visit: Payer: Medicaid Other

## 2012-09-29 VITALS — BP 102/60 | Wt 201.0 lb

## 2012-09-29 DIAGNOSIS — O24419 Gestational diabetes mellitus in pregnancy, unspecified control: Secondary | ICD-10-CM

## 2012-09-29 NOTE — Patient Instructions (Signed)
Fetal Movement Counts Patient Name: __________________________________________________ Patient Due Date: ____________________ Marquis Lunch counts is highly recommended in high risk pregnancies, but it is a good idea for every pregnant woman to do. Start counting fetal movements at 28 weeks of the pregnancy. Fetal movements increase after eating a full meal or eating or drinking something sweet (the blood sugar is higher). It is also important to drink plenty of fluids (well hydrated) before doing the count. Lie on your left side because it helps with the circulation or you can sit in a comfortable chair with your arms over your belly (abdomen) with no distractions around you. DOING THE COUNT  Try to do the count the same time of day each time you do it.  Mark the day and time, then see how long it takes for you to feel 10 movements (kicks, flutters, swishes, rolls). You should have at least 10 movements within 2 hours. You will most likely feel 10 movements in much less than 2 hours. If you do not, wait an hour and count again. After a couple of days you will see a pattern.  What you are looking for is a change in the pattern or not enough counts in 2 hours. Is it taking longer in time to reach 10 movements? SEEK MEDICAL CARE IF:  You feel less than 10 counts in 2 hours. Tried twice.  No movement in one hour.  The pattern is changing or taking longer each day to reach 10 counts in 2 hours.  You feel the baby is not moving as it usually does. Date: ____________ Movements: ____________ Start time: ____________ Elizebeth Koller time: ____________  Date: ____________ Movements: ____________ Start time: ____________ Elizebeth Koller time: ____________ Date: ____________ Movements: ____________ Start time: ____________ Elizebeth Koller time: ____________ Date: ____________ Movements: ____________ Start time: ____________ Elizebeth Koller time: ____________ Date: ____________ Movements: ____________ Start time: ____________ Elizebeth Koller time:  ____________ Date: ____________ Movements: ____________ Start time: ____________ Elizebeth Koller time: ____________ Date: ____________ Movements: ____________ Start time: ____________ Elizebeth Koller time: ____________ Date: ____________ Movements: ____________ Start time: ____________ Elizebeth Koller time: ____________  Date: ____________ Movements: ____________ Start time: ____________ Elizebeth Koller time: ____________ Date: ____________ Movements: ____________ Start time: ____________ Elizebeth Koller time: ____________ Date: ____________ Movements: ____________ Start time: ____________ Elizebeth Koller time: ____________ Date: ____________ Movements: ____________ Start time: ____________ Elizebeth Koller time: ____________ Date: ____________ Movements: ____________ Start time: ____________ Elizebeth Koller time: ____________ Date: ____________ Movements: ____________ Start time: ____________ Elizebeth Koller time: ____________ Date: ____________ Movements: ____________ Start time: ____________ Elizebeth Koller time: ____________  Date: ____________ Movements: ____________ Start time: ____________ Elizebeth Koller time: ____________ Date: ____________ Movements: ____________ Start time: ____________ Elizebeth Koller time: ____________ Date: ____________ Movements: ____________ Start time: ____________ Elizebeth Koller time: ____________ Date: ____________ Movements: ____________ Start time: ____________ Elizebeth Koller time: ____________ Date: ____________ Movements: ____________ Start time: ____________ Elizebeth Koller time: ____________ Date: ____________ Movements: ____________ Start time: ____________ Elizebeth Koller time: ____________ Date: ____________ Movements: ____________ Start time: ____________ Elizebeth Koller time: ____________  Date: ____________ Movements: ____________ Start time: ____________ Elizebeth Koller time: ____________ Date: ____________ Movements: ____________ Start time: ____________ Elizebeth Koller time: ____________ Date: ____________ Movements: ____________ Start time: ____________ Elizebeth Koller time: ____________ Date: ____________ Movements:  ____________ Start time: ____________ Elizebeth Koller time: ____________ Date: ____________ Movements: ____________ Start time: ____________ Elizebeth Koller time: ____________ Date: ____________ Movements: ____________ Start time: ____________ Elizebeth Koller time: ____________ Date: ____________ Movements: ____________ Start time: ____________ Elizebeth Koller time: ____________  Date: ____________ Movements: ____________ Start time: ____________ Elizebeth Koller time: ____________ Date: ____________ Movements: ____________ Start time: ____________ Elizebeth Koller time: ____________ Date: ____________ Movements: ____________ Start time: ____________ Elizebeth Koller time: ____________ Date: ____________ Movements:  ____________ Start time: ____________ Elizebeth Koller time: ____________ Date: ____________ Movements: ____________ Start time: ____________ Elizebeth Koller time: ____________ Date: ____________ Movements: ____________ Start time: ____________ Elizebeth Koller time: ____________ Date: ____________ Movements: ____________ Start time: ____________ Elizebeth Koller time: ____________  Date: ____________ Movements: ____________ Start time: ____________ Elizebeth Koller time: ____________ Date: ____________ Movements: ____________ Start time: ____________ Elizebeth Koller time: ____________ Date: ____________ Movements: ____________ Start time: ____________ Elizebeth Koller time: ____________ Date: ____________ Movements: ____________ Start time: ____________ Elizebeth Koller time: ____________ Date: ____________ Movements: ____________ Start time: ____________ Elizebeth Koller time: ____________ Date: ____________ Movements: ____________ Start time: ____________ Elizebeth Koller time: ____________ Date: ____________ Movements: ____________ Start time: ____________ Elizebeth Koller time: ____________  Date: ____________ Movements: ____________ Start time: ____________ Elizebeth Koller time: ____________ Date: ____________ Movements: ____________ Start time: ____________ Elizebeth Koller time: ____________ Date: ____________ Movements: ____________ Start time: ____________ Elizebeth Koller  time: ____________ Date: ____________ Movements: ____________ Start time: ____________ Elizebeth Koller time: ____________ Date: ____________ Movements: ____________ Start time: ____________ Elizebeth Koller time: ____________ Date: ____________ Movements: ____________ Start time: ____________ Elizebeth Koller time: ____________ Date: ____________ Movements: ____________ Start time: ____________ Elizebeth Koller time: ____________  Date: ____________ Movements: ____________ Start time: ____________ Elizebeth Koller time: ____________ Date: ____________ Movements: ____________ Start time: ____________ Elizebeth Koller time: ____________ Date: ____________ Movements: ____________ Start time: ____________ Elizebeth Koller time: ____________ Date: ____________ Movements: ____________ Start time: ____________ Elizebeth Koller time: ____________ Date: ____________ Movements: ____________ Start time: ____________ Elizebeth Koller time: ____________ Date: ____________ Movements: ____________ Start time: ____________ Elizebeth Koller time: ____________ Document Released: 09/26/2006 Document Revised: 11/19/2011 Document Reviewed: 03/29/2009 Exit[employer] Patient Information 2013 [place].

## 2012-09-29 NOTE — Progress Notes (Signed)
25w4d69-126 2 hr pp 62 -142 [place]5-5 greater 90 % AFI 18.1 cm cx 3.38 cm tvs anterior placenta Blood sugars have improved.  Pt was seen by diabetic specialist  If BS are still elevated in one week can increase glyburide

## 2012-09-29 NOTE — Progress Notes (Signed)
51w4dPt used own glucose monitor=1058mdl @ 4:12p

## 2012-09-30 LAB — US OB FOLLOW UP

## 2012-10-06 ENCOUNTER — Ambulatory Visit: Payer: Medicaid Other | Admitting: Obstetrics and Gynecology

## 2012-10-06 VITALS — BP 102/62 | Wt 201.0 lb

## 2012-10-06 DIAGNOSIS — Z331 Pregnant state, incidental: Secondary | ICD-10-CM

## 2012-10-06 NOTE — Progress Notes (Signed)
Pt stated no issues today.

## 2012-10-06 NOTE — Progress Notes (Signed)
36w4dReviewed FSBG - Currently on Glyberide 2.568mBID Fasting 67-126 PP Lunch 74-124 PP Dinner 112 - 130 Reviewed diet and FKCs At [facility] will start weekly BPP USKorealready sch next week (2/3) for growth with Dr StRaphael Gibney

## 2012-10-13 ENCOUNTER — Ambulatory Visit: Payer: Medicaid Other

## 2012-10-13 ENCOUNTER — Other Ambulatory Visit: Payer: Self-pay | Admitting: Obstetrics and Gynecology

## 2012-10-13 ENCOUNTER — Other Ambulatory Visit: Payer: Self-pay

## 2012-10-13 ENCOUNTER — Encounter: Payer: Self-pay | Admitting: Obstetrics and Gynecology

## 2012-10-13 ENCOUNTER — Ambulatory Visit: Payer: Medicaid Other | Admitting: Obstetrics and Gynecology

## 2012-10-13 VITALS — BP 90/58 | Wt 201.0 lb

## 2012-10-13 DIAGNOSIS — O24419 Gestational diabetes mellitus in pregnancy, unspecified control: Secondary | ICD-10-CM

## 2012-10-13 DIAGNOSIS — N9089 Other specified noninflammatory disorders of vulva and perineum: Secondary | ICD-10-CM

## 2012-10-13 NOTE — Progress Notes (Signed)
33w4dThe patient complains of a tender lesion on her vulva that now is irritated when she sleeps and walks. Examination of the vulva shows a 0.5 cm polypoid lesion on the left labia minor on with an ulcerated tip. Hurricaine gel applied.  Betadine prep. 1% Xylocaine injected.  Free tie a 4-0 Monocryl tied at the base of the lesion.  Lesion cut.  Sent to pathology. Blood sugars are normal.  Continued glyburide 5 mg twice each day.  Ultrasound: Single gestation, vertex, normal fluid, 4 lbs. 15 oz. (76 percentile).  Cervix 3.55 cm.  Biophysical profile 8 out of 8.  Continue current medication.  Repeat biophysical profile in one week. Return to office in 1 week. Dr. SRaphael Gibney

## 2012-10-13 NOTE — Progress Notes (Signed)
41w4dPt has no complaints

## 2012-10-13 NOTE — Progress Notes (Signed)
Per Lezlie Octave in the lab she was unable to draw pt blood for HVS I & II Second attempt tried by TG and unable to draw  Pt states she was dehydrated  Pt needs HSV drawn b/c of vulvar biopsy per AVS.

## 2012-10-13 NOTE — Addendum Note (Signed)
Addended by: Marquette Old on: 10/13/2012 03:25 PM   Modules accepted: Orders

## 2012-10-13 NOTE — Addendum Note (Signed)
Addended by: Marquette Old on: 10/13/2012 12:56 PM   Modules accepted: Orders

## 2012-10-16 LAB — US OB FOLLOW UP

## 2012-10-17 ENCOUNTER — Telehealth: Payer: Self-pay | Admitting: Obstetrics and Gynecology

## 2012-10-17 NOTE — Telephone Encounter (Signed)
The patient was told that her path report is benign.  Dr. Raphael Gibney

## 2012-10-20 ENCOUNTER — Ambulatory Visit: Payer: Medicaid Other | Admitting: Obstetrics and Gynecology

## 2012-10-20 ENCOUNTER — Inpatient Hospital Stay (HOSPITAL_COMMUNITY): Payer: Medicaid Other

## 2012-10-20 ENCOUNTER — Encounter (HOSPITAL_COMMUNITY): Payer: Self-pay | Admitting: *Deleted

## 2012-10-20 ENCOUNTER — Other Ambulatory Visit: Payer: Self-pay

## 2012-10-20 ENCOUNTER — Ambulatory Visit: Payer: Medicaid Other

## 2012-10-20 ENCOUNTER — Other Ambulatory Visit: Payer: Self-pay | Admitting: Obstetrics and Gynecology

## 2012-10-20 ENCOUNTER — Inpatient Hospital Stay (HOSPITAL_COMMUNITY)
Admission: AD | Admit: 2012-10-20 | Discharge: 2012-10-20 | Disposition: A | Discharge status: Home / Self Care | Payer: Medicaid Other | Source: Ambulatory Visit | Attending: Obstetrics and Gynecology | Admitting: Obstetrics and Gynecology

## 2012-10-20 VITALS — BP 116/50 | Wt 203.0 lb

## 2012-10-20 DIAGNOSIS — O288 Other abnormal findings on antenatal screening of mother: Secondary | ICD-10-CM

## 2012-10-20 DIAGNOSIS — O34219 Maternal care for unspecified type scar from previous cesarean delivery: Secondary | ICD-10-CM

## 2012-10-20 DIAGNOSIS — O24419 Gestational diabetes mellitus in pregnancy, unspecified control: Secondary | ICD-10-CM

## 2012-10-20 DIAGNOSIS — O99810 Abnormal glucose complicating pregnancy: Secondary | ICD-10-CM | POA: Insufficient documentation

## 2012-10-20 NOTE — Progress Notes (Signed)
39w4dPt c/o lightheadedness today. Dextrostick-57(pt monitor). Pt given crackers and juice.  Ultrasound shows:  SIUP  S=D     [place]orea[place]D: 12/04/2012            AFI: 18.57CM           Cervical length: not measured            Placenta localization: anterior           Fetal presentation: cephalic                   Anatomy survey is normal           Gender : female  AFI is normal(70th%tile). BPP=6/8,( NO BREATHING MOVEMENT) Cervix closed.  Normal ovaries. No fluid in CDS, Normal adnexas.  NST non reactive. [facility] Ne[facility] I& II @ [facility].  Path from vulvar bx was negative

## 2012-10-20 NOTE — MAU Note (Signed)
Patient was seen in the office today. Had two NST and a BPP 6/8. Was told by Dr. Leo Grosser to come in this evening for another BPP. Good fetal movement. No vaginal bleeding or leaking of fluid.

## 2012-10-21 NOTE — MAU Provider Note (Signed)
S: Pt denies c/o reports GFM, denies ctx, VB or LOF  O: VSS NST reactive cat 1 toco irreg ctx BPP 8/8  A: reassuring testing  P: keep appointments as scheduled rv'd [place]   S.Tar Heel Cellar, CNM

## 2012-10-28 ENCOUNTER — Encounter: Payer: Medicaid Other | Admitting: Obstetrics and Gynecology

## 2012-10-28 ENCOUNTER — Other Ambulatory Visit: Payer: Medicaid Other

## 2012-10-28 ENCOUNTER — Encounter: Payer: Self-pay | Admitting: Obstetrics and Gynecology

## 2012-10-28 ENCOUNTER — Ambulatory Visit: Payer: Medicaid Other | Admitting: Obstetrics and Gynecology

## 2012-10-28 ENCOUNTER — Ambulatory Visit: Payer: Medicaid Other

## 2012-10-28 VITALS — BP 98/60 | Wt 202.0 lb

## 2012-10-28 DIAGNOSIS — Z331 Pregnant state, incidental: Secondary | ICD-10-CM

## 2012-10-28 DIAGNOSIS — O09529 Supervision of elderly multigravida, unspecified trimester: Secondary | ICD-10-CM

## 2012-10-28 DIAGNOSIS — O24419 Gestational diabetes mellitus in pregnancy, unspecified control: Secondary | ICD-10-CM

## 2012-10-28 DIAGNOSIS — O288 Other abnormal findings on antenatal screening of mother: Secondary | ICD-10-CM

## 2012-10-28 NOTE — Progress Notes (Signed)
12w5dUltrasound shows:      [place]oreaEDD: 12/04/12             AFI: 16.4 cm     placenta localization: anterior            Fetal presentation: vertex     Anatomy survey completed YES     Anatomy survey is normal            Gender : female    Comments: BPP score 8/8 GBS done Needs HSV I and II serology at [facility] CBGs fasting all < 100. 2 elevated 2 hrs all associated with dietary lapses.  Pt counseled

## 2012-10-31 ENCOUNTER — Other Ambulatory Visit: Payer: Self-pay

## 2012-10-31 DIAGNOSIS — O99810 Abnormal glucose complicating pregnancy: Secondary | ICD-10-CM

## 2012-11-05 ENCOUNTER — Ambulatory Visit: Payer: Medicaid Other

## 2012-11-05 ENCOUNTER — Encounter: Payer: Self-pay | Admitting: Obstetrics and Gynecology

## 2012-11-05 ENCOUNTER — Ambulatory Visit: Payer: Medicaid Other | Admitting: Obstetrics and Gynecology

## 2012-11-05 VITALS — BP 100/58 | Wt 202.0 lb

## 2012-11-05 DIAGNOSIS — O99810 Abnormal glucose complicating pregnancy: Secondary | ICD-10-CM

## 2012-11-05 DIAGNOSIS — O24419 Gestational diabetes mellitus in pregnancy, unspecified control: Secondary | ICD-10-CM

## 2012-11-05 NOTE — Progress Notes (Signed)
Ultrasound shows:  SIUP  S=D     [place] EDD: 12/07/2012           AFI: 14.31           Cervical length: 3.10 cm           Placenta localization: anterior           Fetal presentation: vertex Comments: BPP 8/8 in 7 minutes

## 2012-11-05 NOTE — Progress Notes (Signed)
[redacted]w[redacted]d GFM Class A2 DB well controlled with Glyburide 2.5 mg

## 2012-11-05 NOTE — Progress Notes (Signed)
7w6dPt has no complaints  BPP today  Copy of blood sugars

## 2012-11-11 ENCOUNTER — Ambulatory Visit: Payer: Medicaid Other | Admitting: Obstetrics and Gynecology

## 2012-11-11 ENCOUNTER — Encounter: Payer: Self-pay | Admitting: Obstetrics and Gynecology

## 2012-11-11 ENCOUNTER — Ambulatory Visit: Payer: Medicaid Other

## 2012-11-11 ENCOUNTER — Other Ambulatory Visit: Payer: Self-pay | Admitting: Obstetrics and Gynecology

## 2012-11-11 VITALS — BP 110/60 | Wt 203.0 lb

## 2012-11-11 DIAGNOSIS — O24419 Gestational diabetes mellitus in pregnancy, unspecified control: Secondary | ICD-10-CM

## 2012-11-11 DIAGNOSIS — O34219 Maternal care for unspecified type scar from previous cesarean delivery: Secondary | ICD-10-CM

## 2012-11-11 NOTE — Progress Notes (Signed)
17w5dEFW 6lbs 10oz 63.9%, AFI 15.7cm, BPP 8/8, vtx, ant placenta CBGs  Mostly good cont glyburide 573mbid FKCs and labor precautions Plans TOL - consent signed today - had c/s then VBAC with Dr. CoGarwin BrothersPP in 1wk with visit

## 2012-11-13 ENCOUNTER — Other Ambulatory Visit: Payer: Self-pay

## 2012-11-13 DIAGNOSIS — O99810 Abnormal glucose complicating pregnancy: Secondary | ICD-10-CM

## 2012-11-17 ENCOUNTER — Ambulatory Visit: Payer: Medicaid Other

## 2012-11-17 ENCOUNTER — Ambulatory Visit: Payer: Medicaid Other | Admitting: Obstetrics and Gynecology

## 2012-11-17 VITALS — BP 90/66 | Wt 203.5 lb

## 2012-11-17 DIAGNOSIS — N9089 Other specified noninflammatory disorders of vulva and perineum: Secondary | ICD-10-CM

## 2012-11-17 DIAGNOSIS — O09529 Supervision of elderly multigravida, unspecified trimester: Secondary | ICD-10-CM

## 2012-11-17 DIAGNOSIS — O99810 Abnormal glucose complicating pregnancy: Secondary | ICD-10-CM

## 2012-11-17 DIAGNOSIS — O24419 Gestational diabetes mellitus in pregnancy, unspecified control: Secondary | ICD-10-CM

## 2012-11-17 NOTE — Progress Notes (Signed)
[redacted]w[redacted]d Pt without copy of CBG's today, but all wnl range per pt, ie all fasting's <90 per her hx. Pt desires cervix check  Ultrasound shows:  SIUP  S=D     [place]EDD: 12/04/2012            AFI: 13.7 cm 50th%tile           Cervical length: not measured            Placenta localization: anterior           Fetal presentation: cephalic                   Anatomy survey is normal  BPP 8/8.  Pt wants VBAC.  She is familiar with induction.  She had Foley cervical ripening, then Pitocin for her last delivery with a successful VBAC.  I reviewed the increased risk of uterine rupture with the use of Pitocin during a TOLAC and she acknowleged understanding Will schedule cervical ripening for 39 weeks. Will obtain HSV serology which has not been obtained.  No lesion or prodrome at this time

## 2012-11-18 ENCOUNTER — Telehealth: Payer: Self-pay | Admitting: Obstetrics and Gynecology

## 2012-11-18 LAB — US OB FOLLOW UP

## 2012-11-18 NOTE — Telephone Encounter (Signed)
Induction scheduled for 11/28/12@ 7:30 pm with ND/SL. -Adrianne Pridgen

## 2012-11-20 ENCOUNTER — Other Ambulatory Visit: Payer: Self-pay

## 2012-11-20 MED ORDER — VALACYCLOVIR HCL 500 MG PO TABS: 500.0000 mg | ORAL_TABLET | Freq: Every day | ORAL | Status: DC

## 2012-11-24 ENCOUNTER — Other Ambulatory Visit: Payer: Medicaid Other

## 2012-11-24 ENCOUNTER — Other Ambulatory Visit: Payer: Self-pay

## 2012-11-24 ENCOUNTER — Encounter (HOSPITAL_COMMUNITY): Payer: Self-pay | Admitting: *Deleted

## 2012-11-24 ENCOUNTER — Encounter: Payer: Medicaid Other | Admitting: Family Medicine

## 2012-11-24 ENCOUNTER — Ambulatory Visit: Payer: Medicaid Other | Admitting: Family Medicine

## 2012-11-24 ENCOUNTER — Telehealth (HOSPITAL_COMMUNITY): Payer: Self-pay | Admitting: *Deleted

## 2012-11-24 VITALS — BP 100/64 | Wt 207.0 lb

## 2012-11-24 DIAGNOSIS — O24419 Gestational diabetes mellitus in pregnancy, unspecified control: Secondary | ICD-10-CM

## 2012-11-24 DIAGNOSIS — Z331 Pregnant state, incidental: Secondary | ICD-10-CM

## 2012-11-24 NOTE — Telephone Encounter (Signed)
Preadmission screen  

## 2012-11-24 NOTE — Progress Notes (Signed)
65w4dDoing well, FSBS log demonstrated WNL.  Reports good fetal movement.  [place] Discussed HSV and medication therapy/US with BPP 8/8.   Discussed light breakfast, avoid spicy, fried. No outbreak noted today on exam. Pt scheduled for induction Friday at [facility]  Pt to call and schedule PP. L.Karel Turpen, FNP-[place]

## 2012-11-24 NOTE — Progress Notes (Signed)
76w4dNo complaints today. Desires cervix check-- Ultrasound shows:  SIUP  S=D     [place]oreaEDD: 12/07/2012           AFI: 14.8 cm/Normal fluid=55th%tile           Cervix not measured                                 Placenta localization: anterior           Fetal presentation: Vertex/Single fetus                                 Note: Score BPP 8 out of 8 in 13 min

## 2012-11-25 ENCOUNTER — Inpatient Hospital Stay (HOSPITAL_COMMUNITY)
Admission: AD | Admit: 2012-11-25 | Discharge: 2012-11-25 | Disposition: A | Discharge status: Home / Self Care | Payer: Medicaid Other | Source: Ambulatory Visit | Attending: Obstetrics and Gynecology | Admitting: Obstetrics and Gynecology

## 2012-11-25 ENCOUNTER — Encounter (HOSPITAL_COMMUNITY): Payer: Self-pay | Admitting: *Deleted

## 2012-11-25 ENCOUNTER — Telehealth: Payer: Self-pay | Admitting: Obstetrics and Gynecology

## 2012-11-25 DIAGNOSIS — O34219 Maternal care for unspecified type scar from previous cesarean delivery: Secondary | ICD-10-CM | POA: Insufficient documentation

## 2012-11-25 DIAGNOSIS — O99810 Abnormal glucose complicating pregnancy: Secondary | ICD-10-CM | POA: Insufficient documentation

## 2012-11-25 DIAGNOSIS — O479 False labor, unspecified: Secondary | ICD-10-CM | POA: Insufficient documentation

## 2012-11-25 NOTE — Progress Notes (Signed)
Written and verbal d/c instructions given and understanding voiced. 
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
## 2012-11-25 NOTE — Telephone Encounter (Signed)
Pt called, is 38w 5d and states tried calling to speak with someone early this am but did not get in contact with anyone, was pressing option 3, pt advised if needed again to press option 4...this am between 04-829 has having contractions 3-4 min apart and vaginal pressure that she rated about 8/10 at the time.  Contractions have now slowed down, but continues w/ the vaginal pressure that is constant, now rates it 6/10, but still feels very uncomfortable.  Pt denies bleeding/LOF, but does have mucusy d/c, does have + fm.  Pt scheduled for induction on Friday.  Consulted w/ [facility], pt to MAU for labor check, JO, CNM and MAU notified.

## 2012-11-25 NOTE — Progress Notes (Signed)
Windy Kalata CNM notified of pt's admission and status. In delivery in Bs and then will see pt. Pt aware

## 2012-11-25 NOTE — MAU Note (Signed)
Contractions on and off since last night. This morning contractions started about 0800.

## 2012-11-26 ENCOUNTER — Inpatient Hospital Stay (HOSPITAL_COMMUNITY): Payer: Medicaid Other | Admitting: Anesthesiology

## 2012-11-26 ENCOUNTER — Inpatient Hospital Stay (HOSPITAL_COMMUNITY)
Admission: AD | Admit: 2012-11-26 | Discharge: 2012-11-28 | DRG: 774 | Disposition: A | Discharge status: Home / Self Care | Payer: Medicaid Other | Source: Ambulatory Visit | Attending: Obstetrics and Gynecology | Admitting: Obstetrics and Gynecology

## 2012-11-26 ENCOUNTER — Encounter (HOSPITAL_COMMUNITY): Payer: Self-pay | Admitting: *Deleted

## 2012-11-26 ENCOUNTER — Encounter (HOSPITAL_COMMUNITY): Payer: Self-pay | Admitting: Anesthesiology

## 2012-11-26 DIAGNOSIS — O3660X0 Maternal care for excessive fetal growth, unspecified trimester, not applicable or unspecified: Secondary | ICD-10-CM | POA: Diagnosis present

## 2012-11-26 DIAGNOSIS — O864 Pyrexia of unknown origin following delivery: Secondary | ICD-10-CM

## 2012-11-26 DIAGNOSIS — D259 Leiomyoma of uterus, unspecified: Secondary | ICD-10-CM | POA: Diagnosis present

## 2012-11-26 DIAGNOSIS — O34599 Maternal care for other abnormalities of gravid uterus, unspecified trimester: Secondary | ICD-10-CM | POA: Diagnosis present

## 2012-11-26 DIAGNOSIS — IMO0001 Reserved for inherently not codable concepts without codable children: Secondary | ICD-10-CM

## 2012-11-26 DIAGNOSIS — O09529 Supervision of elderly multigravida, unspecified trimester: Secondary | ICD-10-CM | POA: Diagnosis present

## 2012-11-26 DIAGNOSIS — O99814 Abnormal glucose complicating childbirth: Principal | ICD-10-CM | POA: Diagnosis present

## 2012-11-26 DIAGNOSIS — K649 Unspecified hemorrhoids: Secondary | ICD-10-CM | POA: Diagnosis present

## 2012-11-26 DIAGNOSIS — O09899 Supervision of other high risk pregnancies, unspecified trimester: Secondary | ICD-10-CM

## 2012-11-26 DIAGNOSIS — A6000 Herpesviral infection of urogenital system, unspecified: Secondary | ICD-10-CM | POA: Diagnosis present

## 2012-11-26 DIAGNOSIS — K5190 Ulcerative colitis, unspecified, without complications: Secondary | ICD-10-CM

## 2012-11-26 DIAGNOSIS — O34219 Maternal care for unspecified type scar from previous cesarean delivery: Secondary | ICD-10-CM

## 2012-11-26 DIAGNOSIS — O98519 Other viral diseases complicating pregnancy, unspecified trimester: Secondary | ICD-10-CM | POA: Diagnosis present

## 2012-11-26 DIAGNOSIS — D4959 Neoplasm of unspecified behavior of other genitourinary organ: Secondary | ICD-10-CM | POA: Diagnosis present

## 2012-11-26 DIAGNOSIS — O878 Other venous complications in the puerperium: Secondary | ICD-10-CM | POA: Diagnosis present

## 2012-11-26 DIAGNOSIS — O09523 Supervision of elderly multigravida, third trimester: Secondary | ICD-10-CM

## 2012-11-26 HISTORY — DX: Maternal care for excessive fetal growth, unspecified trimester, not applicable or unspecified: O36.60X0

## 2012-11-26 LAB — RPR: RPR Ser Ql: NONREACTIVE

## 2012-11-26 LAB — GLUCOSE, CAPILLARY
Glucose-Capillary: 130 mg/dL — ABNORMAL HIGH (ref 70–99)
Glucose-Capillary: 130 mg/dL — ABNORMAL HIGH (ref 70–99)

## 2012-11-26 LAB — CBC
Hemoglobin: 13.3 g/dL (ref 12.0–15.0)
MCH: 29.3 pg (ref 26.0–34.0)
MCHC: 33.4 g/dL (ref 30.0–36.0)
MCV: 87.7 fL (ref 78.0–100.0)
Platelets: 206 10*3/uL (ref 150–400)

## 2012-11-26 LAB — TYPE AND SCREEN: Antibody Screen: NEGATIVE

## 2012-11-26 LAB — OB RESULTS CONSOLE GC/CHLAMYDIA: Chlamydia: NEGATIVE

## 2012-11-26 MED ORDER — OXYCODONE-ACETAMINOPHEN 5-325 MG PO TABS: 1.0000 | ORAL_TABLET | ORAL | Status: DC | PRN

## 2012-11-26 MED ORDER — PHENYLEPHRINE 40 MCG/ML (10ML) SYRINGE FOR IV PUSH (FOR BLOOD PRESSURE SUPPORT)
80.0000 ug | PREFILLED_SYRINGE | INTRAVENOUS | Status: DC | PRN
  Filled 2012-11-26: qty 5

## 2012-11-26 MED ORDER — LACTATED RINGERS IV SOLN: 500.0000 mL | Freq: Once | INTRAVENOUS | Status: DC

## 2012-11-26 MED ORDER — ZOLPIDEM TARTRATE 5 MG PO TABS: 5.0000 mg | ORAL_TABLET | Freq: Every evening | ORAL | Status: DC | PRN

## 2012-11-26 MED ORDER — DEXTROSE IN LACTATED RINGERS 5 % IV SOLN: INTRAVENOUS | Status: DC

## 2012-11-26 MED ORDER — BENZOCAINE-MENTHOL 20-0.5 % EX AERO
1.0000 "application " | INHALATION_SPRAY | CUTANEOUS | Status: DC | PRN
  Administered 2012-11-26: 1 via TOPICAL
  Filled 2012-11-26: qty 56

## 2012-11-26 MED ORDER — FENTANYL CITRATE 0.05 MG/ML IJ SOLN: 100.0000 ug | INTRAMUSCULAR | Status: DC | PRN

## 2012-11-26 MED ORDER — WITCH HAZEL-GLYCERIN EX PADS
1.0000 "application " | MEDICATED_PAD | CUTANEOUS | Status: DC | PRN
  Administered 2012-11-26: 1 via TOPICAL

## 2012-11-26 MED ORDER — METHYLERGONOVINE MALEATE 0.2 MG/ML IJ SOLN: 0.2000 mg | INTRAMUSCULAR | Status: DC | PRN

## 2012-11-26 MED ORDER — DIPHENHYDRAMINE HCL 50 MG/ML IJ SOLN: 12.5000 mg | INTRAMUSCULAR | Status: DC | PRN

## 2012-11-26 MED ORDER — CITRIC ACID-SODIUM CITRATE 334-500 MG/5ML PO SOLN: 30.0000 mL | ORAL | Status: DC | PRN

## 2012-11-26 MED ORDER — TETANUS-DIPHTH-ACELL PERTUSSIS 5-2.5-18.5 LF-MCG/0.5 IM SUSP: 0.5000 mL | Freq: Once | INTRAMUSCULAR | Status: DC

## 2012-11-26 MED ORDER — MEASLES, MUMPS & RUBELLA VAC ~~LOC~~ INJ: 0.5000 mL | INJECTION | Freq: Once | SUBCUTANEOUS | Status: DC

## 2012-11-26 MED ORDER — METHYLERGONOVINE MALEATE 0.2 MG PO TABS: 0.2000 mg | ORAL_TABLET | ORAL | Status: DC | PRN

## 2012-11-26 MED ORDER — LIDOCAINE HCL (PF) 1 % IJ SOLN
INTRAMUSCULAR | Status: DC | PRN
  Administered 2012-11-26 (×4): 4 mL

## 2012-11-26 MED ORDER — LIDOCAINE HCL (PF) 1 % IJ SOLN
30.0000 mL | INTRAMUSCULAR | Status: DC | PRN
  Filled 2012-11-26: qty 30

## 2012-11-26 MED ORDER — SENNOSIDES-DOCUSATE SODIUM 8.6-50 MG PO TABS
2.0000 | ORAL_TABLET | Freq: Every day | ORAL | Status: DC
  Administered 2012-11-26 – 2012-11-27 (×2): 2 via ORAL

## 2012-11-26 MED ORDER — ONDANSETRON HCL 4 MG/2ML IJ SOLN: 4.0000 mg | INTRAMUSCULAR | Status: DC | PRN

## 2012-11-26 MED ORDER — LANOLIN HYDROUS EX OINT: TOPICAL_OINTMENT | CUTANEOUS | Status: DC | PRN

## 2012-11-26 MED ORDER — MEDROXYPROGESTERONE ACETATE 150 MG/ML IM SUSP: 150.0000 mg | INTRAMUSCULAR | Status: DC | PRN

## 2012-11-26 MED ORDER — MAGNESIUM HYDROXIDE 400 MG/5ML PO SUSP: 30.0000 mL | ORAL | Status: DC | PRN

## 2012-11-26 MED ORDER — PRENATAL MULTIVITAMIN CH
1.0000 | ORAL_TABLET | Freq: Every day | ORAL | Status: DC
  Administered 2012-11-27: 1 via ORAL
  Filled 2012-11-26: qty 1

## 2012-11-26 MED ORDER — OXYTOCIN BOLUS FROM INFUSION: 500.0000 mL | INTRAVENOUS | Status: DC

## 2012-11-26 MED ORDER — OXYCODONE-ACETAMINOPHEN 5-325 MG PO TABS
1.0000 | ORAL_TABLET | ORAL | Status: DC | PRN
  Administered 2012-11-26: 1 via ORAL
  Filled 2012-11-26: qty 1

## 2012-11-26 MED ORDER — LACTATED RINGERS IV SOLN: 500.0000 mL | INTRAVENOUS | Status: DC | PRN

## 2012-11-26 MED ORDER — ONDANSETRON HCL 4 MG/2ML IJ SOLN: 4.0000 mg | Freq: Four times a day (QID) | INTRAMUSCULAR | Status: DC | PRN

## 2012-11-26 MED ORDER — IBUPROFEN 600 MG PO TABS
600.0000 mg | ORAL_TABLET | Freq: Four times a day (QID) | ORAL | Status: DC
  Administered 2012-11-26 – 2012-11-28 (×7): 600 mg via ORAL
  Filled 2012-11-26 (×7): qty 1

## 2012-11-26 MED ORDER — EPHEDRINE 5 MG/ML INJ: 10.0000 mg | INTRAVENOUS | Status: DC | PRN

## 2012-11-26 MED ORDER — FENTANYL 2.5 MCG/ML BUPIVACAINE 1/10 % EPIDURAL INFUSION (WH - ANES): 14.0000 mL/h | INTRAMUSCULAR | Status: DC | PRN

## 2012-11-26 MED ORDER — ONDANSETRON HCL 4 MG PO TABS: 4.0000 mg | ORAL_TABLET | ORAL | Status: DC | PRN

## 2012-11-26 MED ORDER — DIPHENHYDRAMINE HCL 25 MG PO CAPS: 25.0000 mg | ORAL_CAPSULE | Freq: Four times a day (QID) | ORAL | Status: DC | PRN

## 2012-11-26 MED ORDER — SODIUM CHLORIDE 0.9 % IV SOLN
INTRAVENOUS | Status: DC
  Administered 2012-11-26: 0.7 [IU]/h via INTRAVENOUS
  Filled 2012-11-26: qty 1

## 2012-11-26 MED ORDER — ACETAMINOPHEN 325 MG PO TABS: 650.0000 mg | ORAL_TABLET | ORAL | Status: DC | PRN

## 2012-11-26 MED ORDER — PHENYLEPHRINE 40 MCG/ML (10ML) SYRINGE FOR IV PUSH (FOR BLOOD PRESSURE SUPPORT): 80.0000 ug | PREFILLED_SYRINGE | INTRAVENOUS | Status: DC | PRN

## 2012-11-26 MED ORDER — OXYTOCIN 40 UNITS IN LACTATED RINGERS INFUSION - SIMPLE MED
62.5000 mL/h | INTRAVENOUS | Status: DC
  Administered 2012-11-26: 62.5 mL/h via INTRAVENOUS
  Filled 2012-11-26: qty 1000

## 2012-11-26 MED ORDER — EPHEDRINE 5 MG/ML INJ
10.0000 mg | INTRAVENOUS | Status: DC | PRN
  Filled 2012-11-26: qty 4

## 2012-11-26 MED ORDER — IBUPROFEN 600 MG PO TABS: 600.0000 mg | ORAL_TABLET | Freq: Four times a day (QID) | ORAL | Status: DC | PRN

## 2012-11-26 MED ORDER — DIBUCAINE 1 % RE OINT
1.0000 "application " | TOPICAL_OINTMENT | RECTAL | Status: DC | PRN
  Administered 2012-11-27: 1 via RECTAL
  Filled 2012-11-26: qty 28

## 2012-11-26 MED ORDER — SIMETHICONE 80 MG PO CHEW: 80.0000 mg | CHEWABLE_TABLET | ORAL | Status: DC | PRN

## 2012-11-26 MED ORDER — FENTANYL 2.5 MCG/ML BUPIVACAINE 1/10 % EPIDURAL INFUSION (WH - ANES)
12.0000 mL/h | INTRAMUSCULAR | Status: DC | PRN
  Administered 2012-11-26: 14 mL/h via EPIDURAL
  Filled 2012-11-26: qty 125

## 2012-11-26 MED ORDER — LACTATED RINGERS IV SOLN
INTRAVENOUS | Status: DC
  Administered 2012-11-26 (×2): via INTRAVENOUS

## 2012-11-26 MED ORDER — GLYBURIDE 5 MG PO TABS
5.0000 mg | ORAL_TABLET | Freq: Two times a day (BID) | ORAL | Status: DC
  Administered 2012-11-27 – 2012-11-28 (×3): 5 mg via ORAL
  Filled 2012-11-26 (×3): qty 1
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
## 2012-11-26 NOTE — Progress Notes (Signed)
IT on phone, notified of glucose stabilizer not installed on any computers,  Awaiting IT to install glucose stabilizer on computers, in order to start insulin.

## 2012-11-26 NOTE — Anesthesia Procedure Notes (Signed)
Epidural Patient location during procedure: OB Start time: 11/26/2012 9:35 AM  Staffing Performed by: anesthesiologist   Preanesthetic Checklist Completed: patient identified, site marked, surgical consent, pre-op evaluation, timeout performed, IV checked, risks and benefits discussed and monitors and equipment checked  Epidural Patient position: sitting Prep: site prepped and draped and DuraPrep Patient monitoring: continuous pulse ox and blood pressure Approach: midline Injection technique: LOR air  Needle:  Needle type: Tuohy  Needle gauge: 17 G Needle length: 9 cm and 9 Needle insertion depth: 6 cm Catheter type: closed end flexible Catheter size: 19 Gauge Catheter at skin depth: 11 cm Test dose: negative  Assessment Events: blood not aspirated, injection not painful, no injection resistance, negative IV test and no paresthesia  Additional Notes Discussed risk of headache, infection, bleeding, nerve injury and failed or incomplete block.  Patient voices understanding and wishes to proceed.  Epidural placed easily on first attempt.  No paresthesia.  Patient tolerated procedure well with no apparent complications.  Charlton Haws, MDReason for block:procedure for pain

## 2012-11-26 NOTE — Progress Notes (Signed)
Subjective: Pt getting comfortable s/p epidural.  S.o. Remains at bs.  Anesthesia remains in room.    Objective: BP 100/77  Pulse 83  Temp(Src) 98 F (36.7 C) (Oral)  Resp 18  Ht 5' 5"  (1.651 m)  Wt 207 lb (93.895 kg)  BMI 34.45 kg/m2  SpO2 100%  LMP 02/28/2012      FHT:  FHR: 155 bpm, variability: minimal ,  accelerations:  Present,  decelerations:  Present few lates since epidural UC:   regular, every 2-4 minutes couplet pattern SVE:   Dilation: 4.5 Effacement (%): 80 Station: -3 Exam by:: [employer] CNM .Marland Kitchen CBG (last 3)   Recent Labs  11/26/12 0839  GLUCAP 135*     Labs: Lab Results  Component Value Date   WBC 7.7 11/26/2012   HGB 13.3 11/26/2012   HCT 39.8 11/26/2012   MCV 87.7 11/26/2012   PLT 206 11/26/2012    Assessment / Plan: 1. 71w6d2. active labor 3. GDM-Glyburide 4. GBS neg 5. suspect SROM, but not confirmed on arrival to hospital secondary to significant pain 6. TOLAC  Labor: Progressing normally Preeclampsia:  no signs or symptoms of toxicity Fetal Wellbeing:  Category II Pain Control:  Epidural I/D:  n/a Anticipated MOD:  NSVD 1. Will c/w AVS r/e cbg's.  Pitocin prn further augmentation. 2. Anticipate SVD Orris Perin H 11/26/2012, 10:02 AM

## 2012-11-26 NOTE — Anesthesia Preprocedure Evaluation (Signed)
Anesthesia Evaluation  Patient identified by MRN, date of birth, ID band Patient awake    Reviewed: Allergy & Precautions, H&P , NPO status , Patient's Chart, lab work & pertinent test results, reviewed documented beta blocker date and time   History of Anesthesia Complications Negative for: history of anesthetic complications  Airway Mallampati: II TM Distance: >3 FB Neck ROM: full    Dental  (+) Teeth Intact   Pulmonary neg pulmonary ROS,  breath sounds clear to auscultation        Cardiovascular negative cardio ROS  Rhythm:regular Rate:Normal     Neuro/Psych negative neurological ROS  negative psych ROS   GI/Hepatic Neg liver ROS, Ulcerative colitis   Endo/Other  diabetes, Gestational, Oral Hypoglycemic Agents  Renal/GU negative Renal ROS  negative genitourinary   Musculoskeletal   Abdominal   Peds  Hematology negative hematology ROS (+) Blood dyscrasia (had blood transfusion with PPH after VBAC in 3/13), anemia ,   Anesthesia Other Findings   Reproductive/Obstetrics (+) Pregnancy (h/o c/s x1, h/o VBAC x1 (with PPH), attempting VBAC)                           Anesthesia Physical Anesthesia Plan  ASA: II  Anesthesia Plan: Epidural   Post-op Pain Management:    Induction:   Airway Management Planned:   Additional Equipment:   Intra-op Plan:   Post-operative Plan:   Informed Consent: I have reviewed the patients History and Physical, chart, labs and discussed the procedure including the risks, benefits and alternatives for the proposed anesthesia with the patient or authorized representative who has indicated his/her understanding and acceptance.     Plan Discussed with:   Anesthesia Plan Comments:         Anesthesia Quick Evaluation

## 2012-11-27 LAB — GLUCOSE, CAPILLARY: Glucose-Capillary: 81 mg/dL (ref 70–99)

## 2012-11-27 LAB — CBC
Platelets: 174 10*3/uL (ref 150–400)
RBC: 4.02 MIL/uL (ref 3.87–5.11)
WBC: 12.5 10*3/uL — ABNORMAL HIGH (ref 4.0–10.5)

## 2012-11-27 NOTE — Anesthesia Postprocedure Evaluation (Signed)
  Anesthesia Post-op Note  Patient: Katherine Mitchell  Procedure(s) Performed: * No procedures listed *  Patient Location: PACU and Mother/Baby  Anesthesia Type:Epidural  Level of Consciousness: awake  Airway and Oxygen Therapy: Patient Spontanous Breathing  Post-op Pain: none  Post-op Assessment: Patient's Cardiovascular Status Stable, Respiratory Function Stable, Patent Airway, No signs of Nausea or vomiting, Adequate PO intake, Pain level controlled, No headache, No backache, No residual numbness and No residual motor weakness  Post-op Vital Signs: Reviewed and stable  Complications: No apparent anesthesia complications

## 2012-11-27 NOTE — Progress Notes (Signed)
UR chart review completed.  

## 2012-11-27 NOTE — Progress Notes (Signed)
Post Partum Day 1: S/P VBAC and GDM  H/o GDM in last 2 pregnancies  Subjective: Patient up ad lib, denies syncope or dizziness. Feeding:  Breastfeeding Contraceptive plan:   unknown  Objective: Blood pressure 104/67, pulse 80, temperature 97.9 F (36.6 C), temperature source Oral, resp. rate 18, height 5' 5"  (1.651 m), weight 207 lb (93.895 kg), last menstrual period 02/28/2012, SpO2 98.00%, unknown if currently breastfeeding.  Physical Exam:  General: alert Lochia: appropriate Uterine Fundus: firm Incision: healing well DVT Evaluation: No evidence of DVT seen on physical exam. Negative Homan's sign.   Recent Labs  11/26/12 0830 11/27/12 0705  HGB 13.3 11.5*  HCT 39.8 35.5*    Assessment/Plan: S/P Vaginal delivery day 1  Continue current care Plan for discharge tomorrow   LOS: 1 day   Kyesha Balla 11/27/2012, 9:24 AM

## 2012-11-28 ENCOUNTER — Inpatient Hospital Stay (HOSPITAL_COMMUNITY): Admission: RE | Admit: 2012-11-28 | Payer: Medicaid Other | Source: Ambulatory Visit

## 2012-11-28 ENCOUNTER — Encounter (HOSPITAL_COMMUNITY): Payer: Self-pay | Admitting: *Deleted

## 2012-11-28 DIAGNOSIS — O09899 Supervision of other high risk pregnancies, unspecified trimester: Secondary | ICD-10-CM

## 2012-11-28 HISTORY — DX: Supervision of other high risk pregnancies, unspecified trimester: O09.899

## 2012-11-28 MED ORDER — HYDROCORTISONE ACE-PRAMOXINE 1-1 % RE FOAM: 1.0000 | Freq: Three times a day (TID) | RECTAL | Status: DC

## 2012-11-28 MED ORDER — IBUPROFEN 600 MG PO TABS: 600.0000 mg | ORAL_TABLET | Freq: Four times a day (QID) | ORAL | Status: DC
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
## 2013-04-26 IMAGING — US US PELVIS COMPLETE
1 series · 13 of 25 positions shown · non-contrast
Comparison: None.

CLINICAL DATA: Post vaginal delivery on 11/12/2011 with fever and
heavy bleeding.  Question retained products of conception

TRANSABDOMINAL ULTRASOUND OF PELVIS
TECHNIQUE: Transabdominal ultrasound examination of the pelvis was
performed including evaluation of the uterus, ovaries, adnexal
regions, and pelvic cul-de-sac.

[Series 1: us pelvis complete · 13 of 40 slices shown]
[im 1/40]
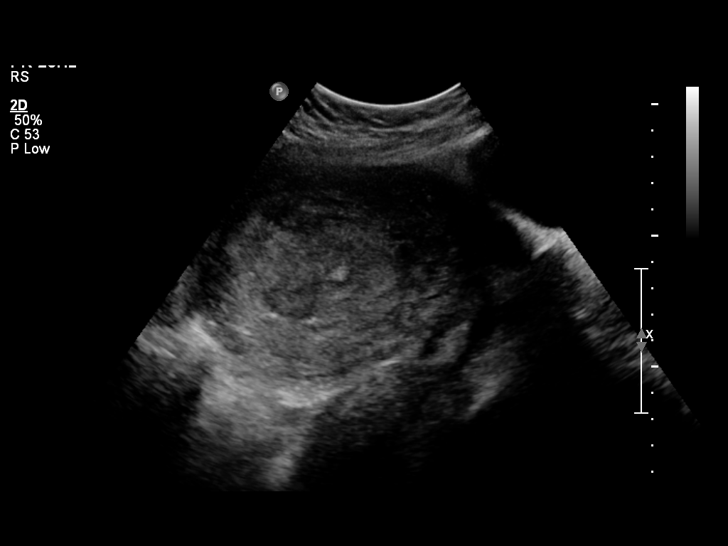
[im 4/40]
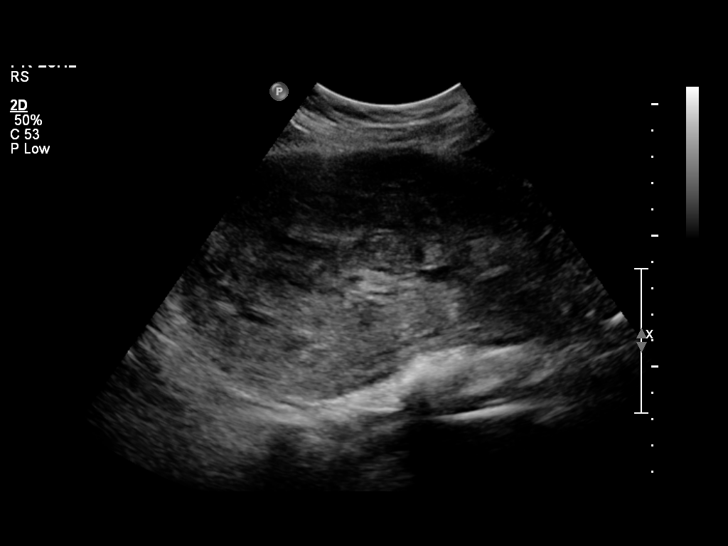
[im 7/40]
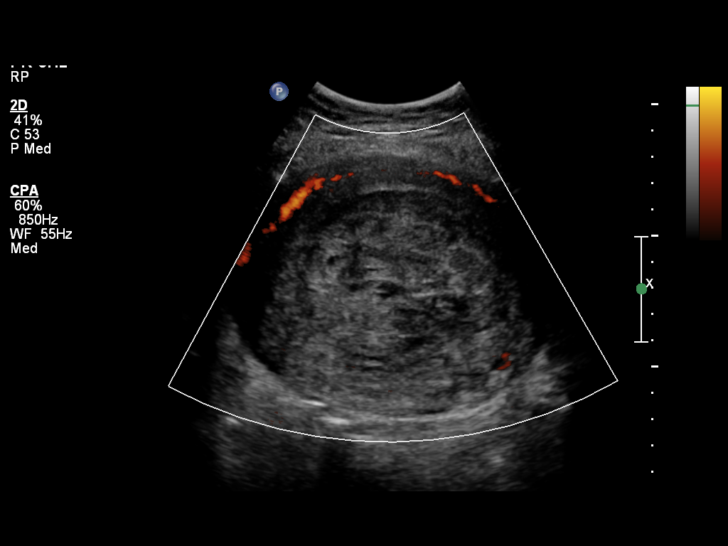
[im 10/40]
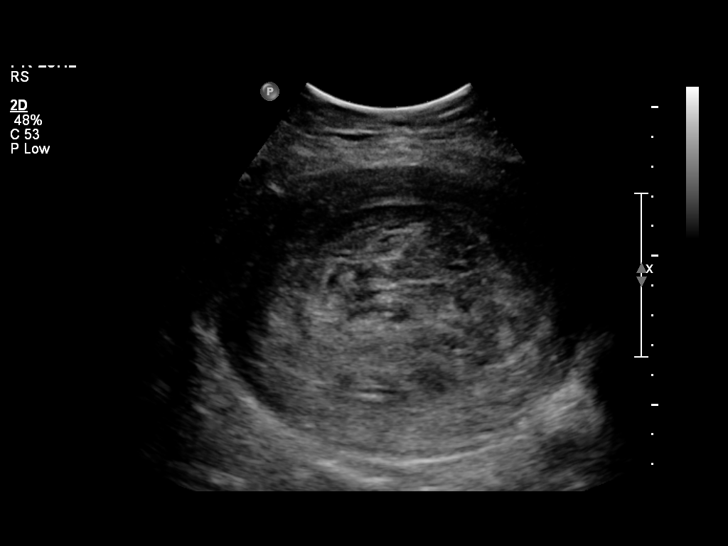
[im 14/40]
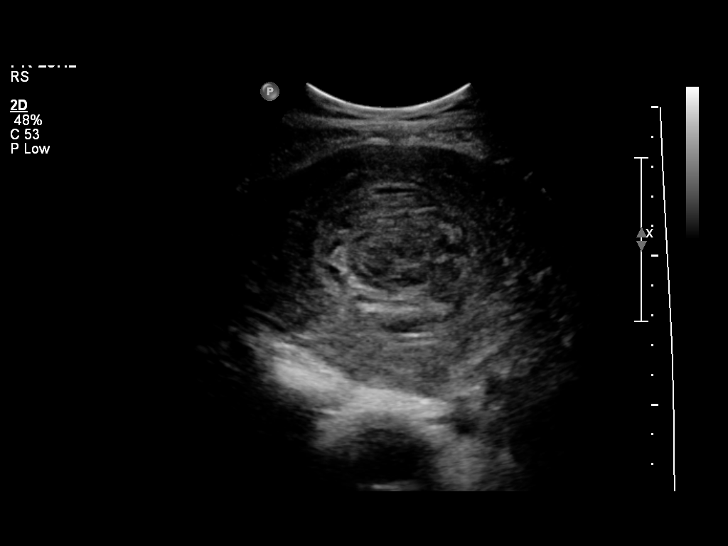
[im 17/40]
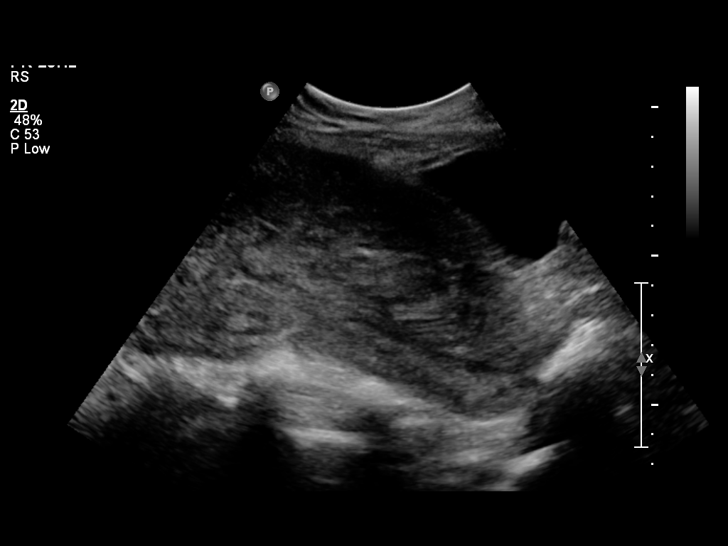
[im 20/40]
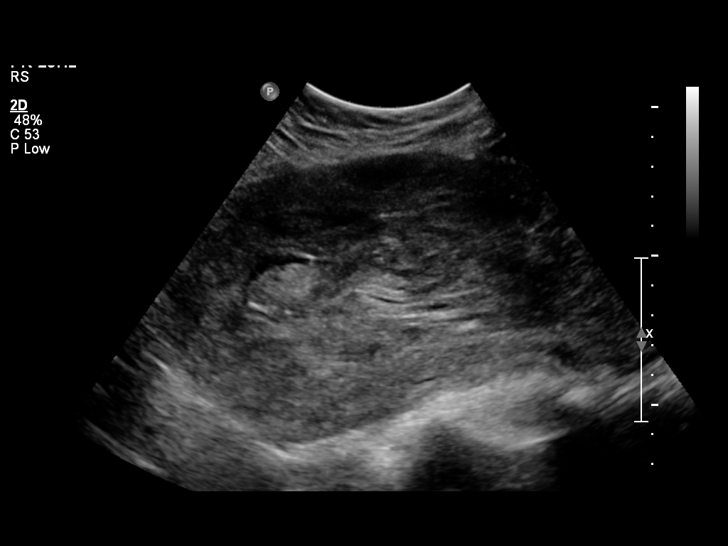
[im 23/40]
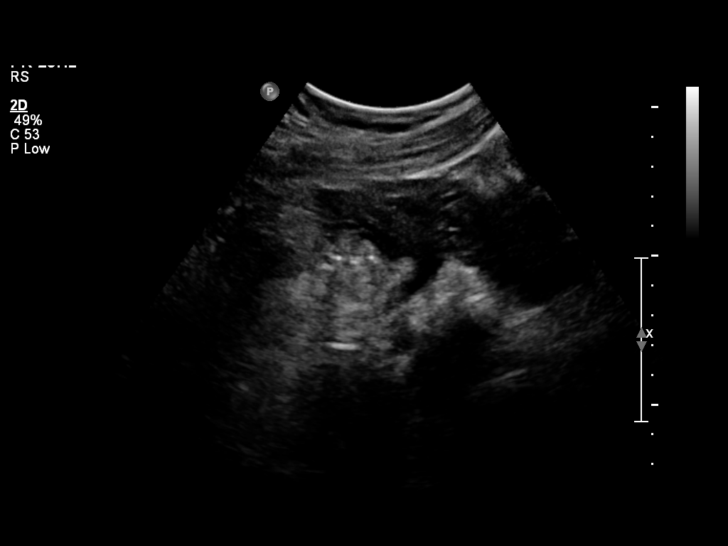
[im 27/40]
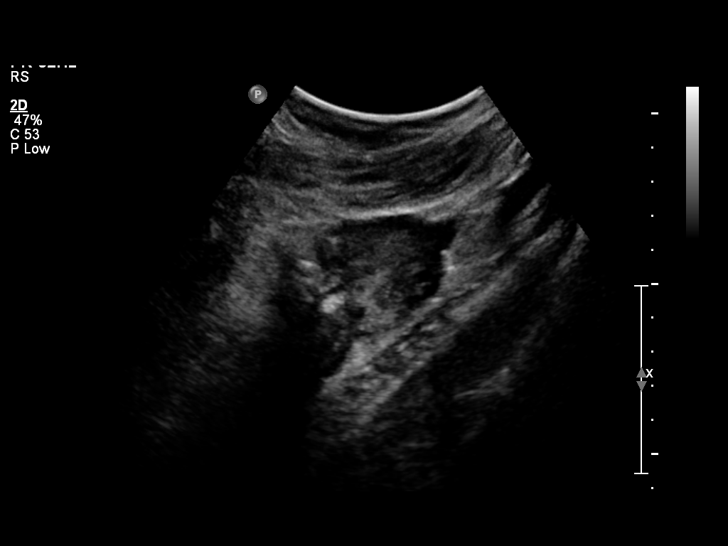
[im 30/40]
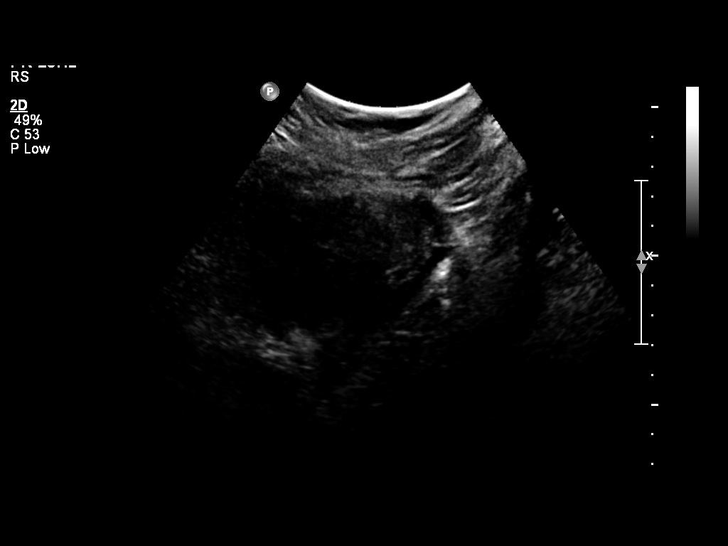
[im 33/40]
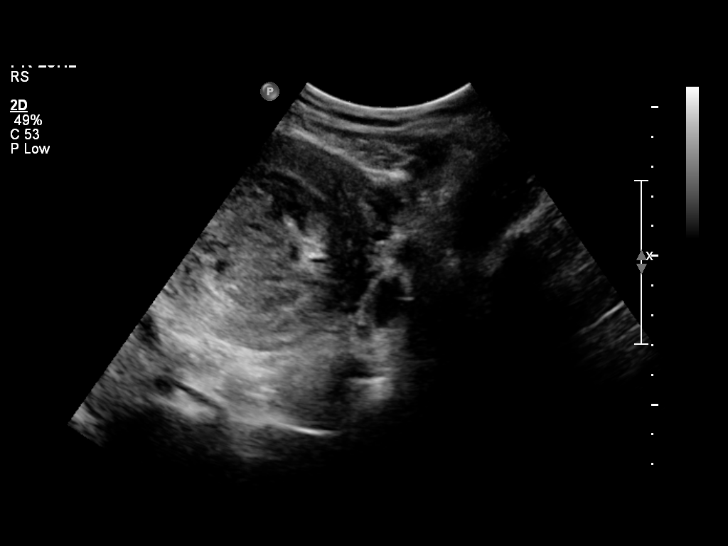
[im 36/40]
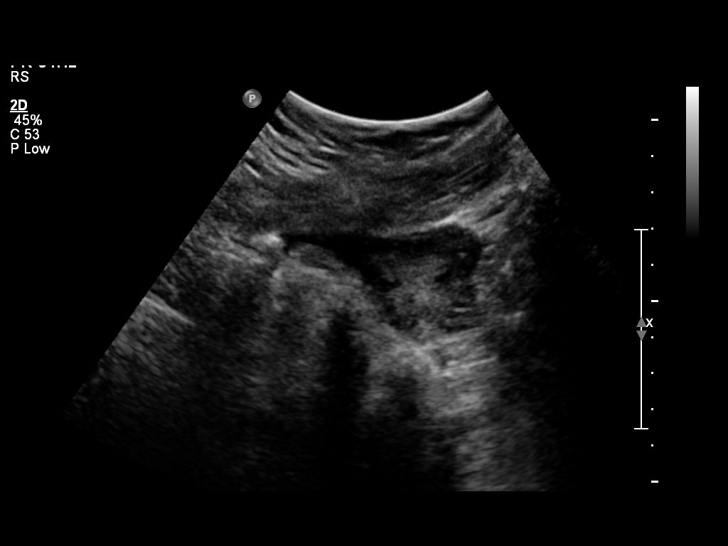
[im 40/40]
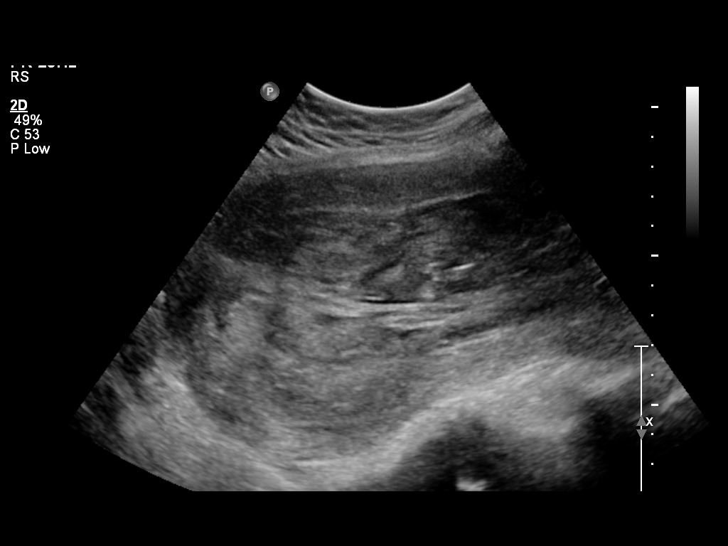

[13 of 25 positions shown; findings below may reference images not displayed]

FINDINGS: Uterus:  Is not in the large compatible with postpartum status
measuring approximately 16 cm in length, 6.7 cm in depth and
cm in width.

Endometrium: Is distended with heterogeneous material with a depth
of 4.8 cm , width of 6 cm and extending along the length of the
endometrial canal.  Color Doppler reveals no evidence for flow
within the heterogeneous material.

Right ovary: Has a normal appearance measuring 3.0 x 3.5 by 3.3 cm

Left ovary: Has a normal appearance measuring 3.1 x 3.3 by 1.7 cm

Other Findings:  No pelvic fluid or separate adnexal masses are
noted.
IMPRESSION: Distended endometrial canal containing heterogeneous avascular soft
tissue. While this cannot be confirmed to represent retained
products due to the lack of internal flow, the large amount of
material would make it suspicious for retained products of
conception even with the lack of associated vascularity. A large
amount of retained clot could give a similar appearance.

## 2013-05-06 ENCOUNTER — Other Ambulatory Visit: Payer: Self-pay | Admitting: Obstetrics and Gynecology

## 2014-03-25 IMAGING — US US FETAL BPP W/O NONSTRESS
2 series · 11 of 11 positions shown · non-contrast
Comparison: None

CLINICAL DATA: Gestational diabetes.  Nonreactive nonstress test.

ULTRAOUND FETAL BPP W/O NONSTRESS

[Series 1: us fetal bpp w/o nonstress · non-contrast · 1 of 1 slices shown (1 of 2)]
[im 1/1]
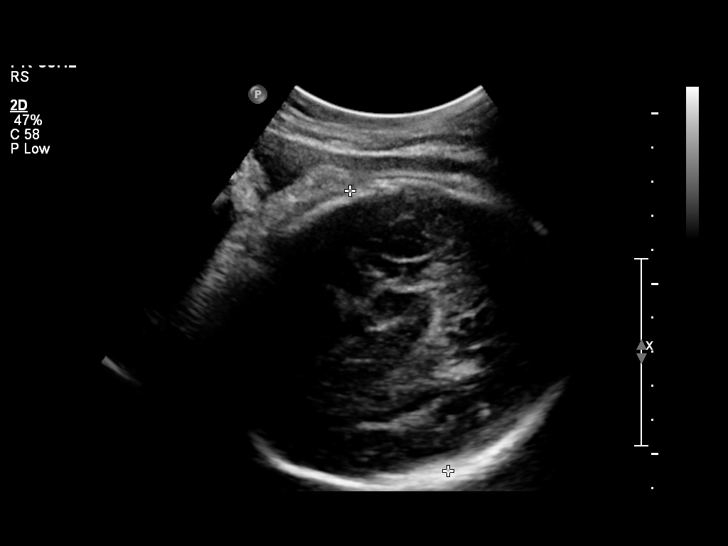

[Series 1: us fetal bpp w/o nonstress · non-contrast · 10 acquisitions, 10 frames shown (2 of 2)]
[im 1/10]
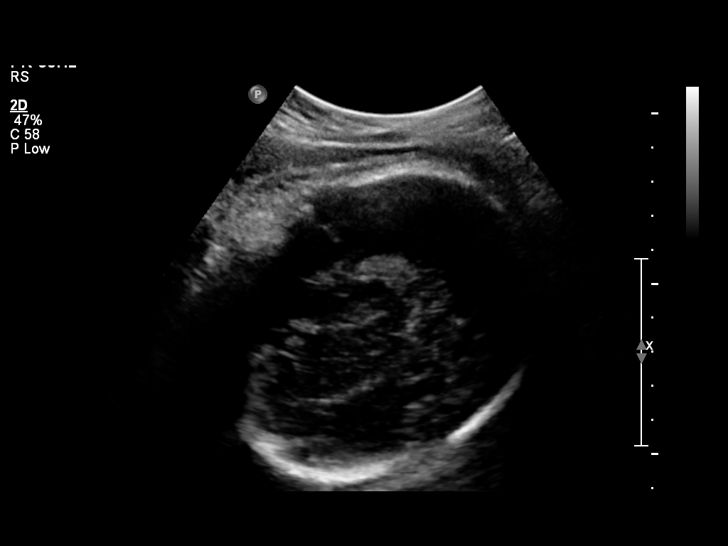
[im 2/10]
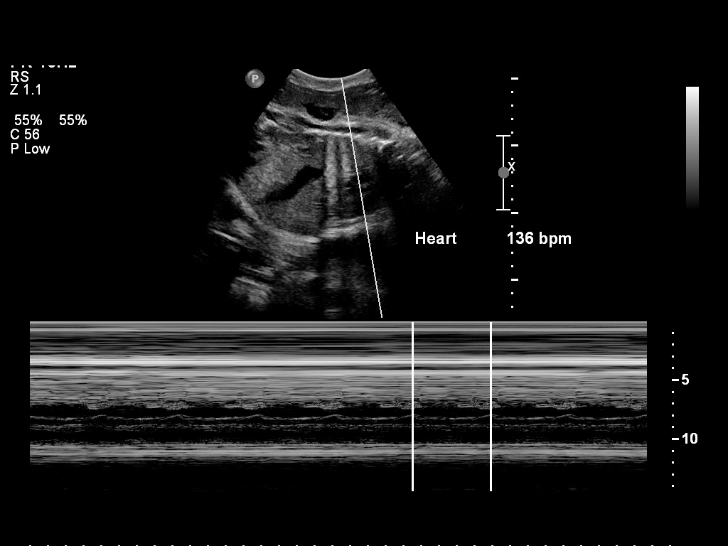
[im 3/10]
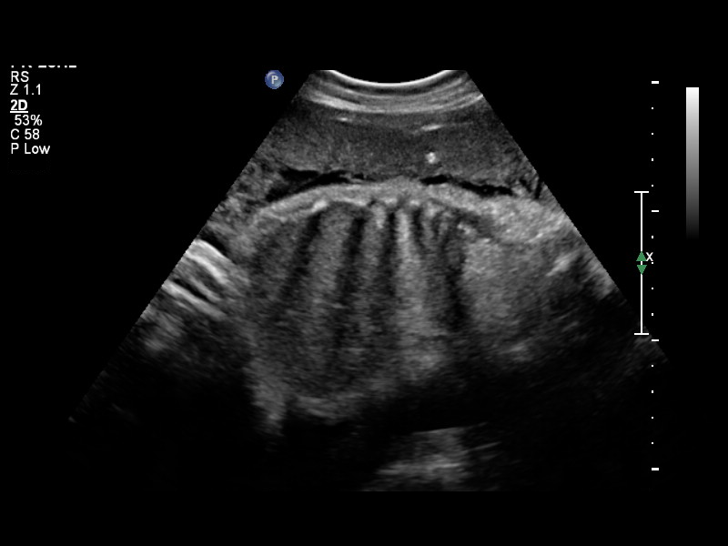
[im 4/10]
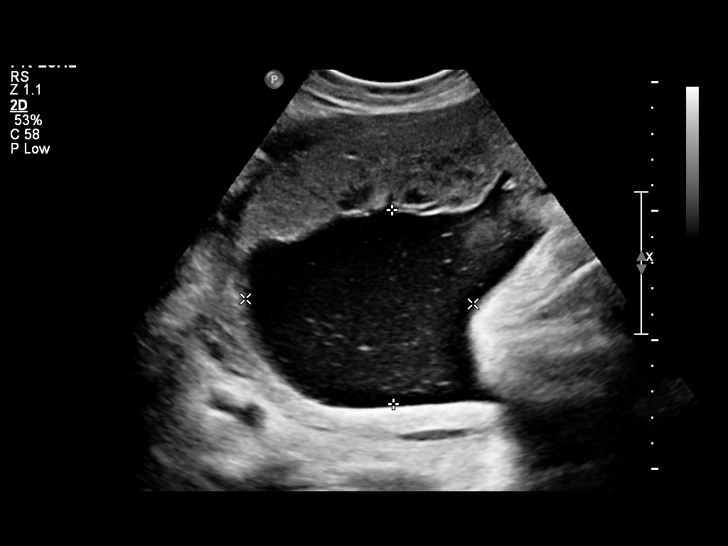
[im 5/10]
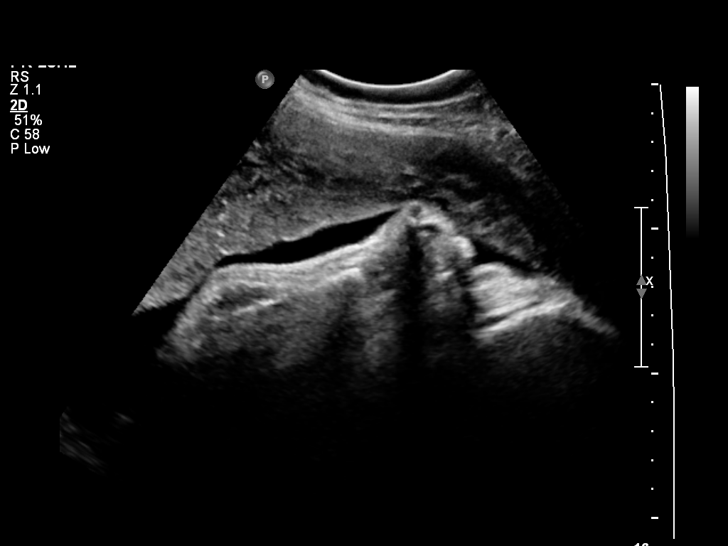
[im 6/10]
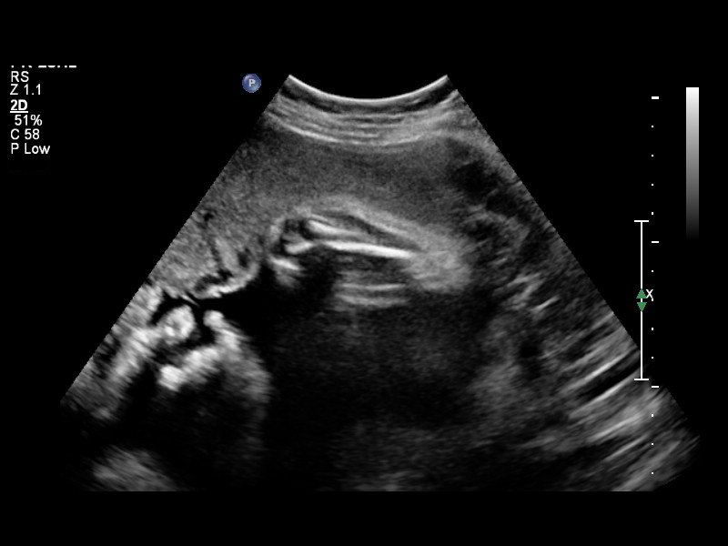
[im 7/10]
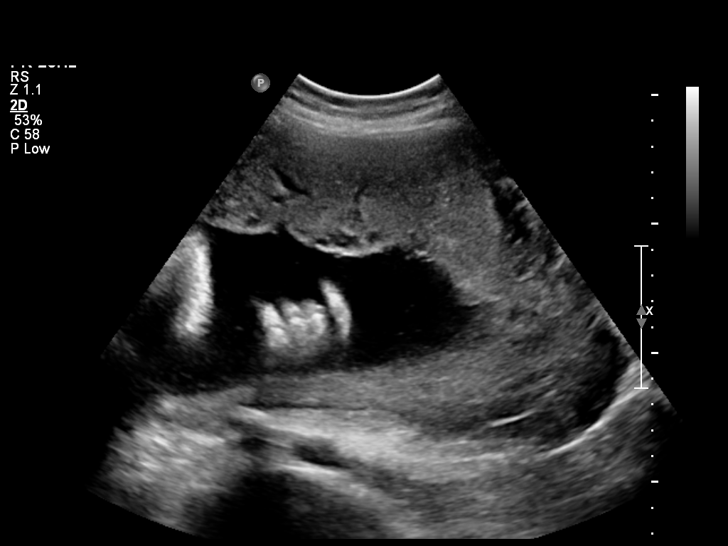
[im 8/10]
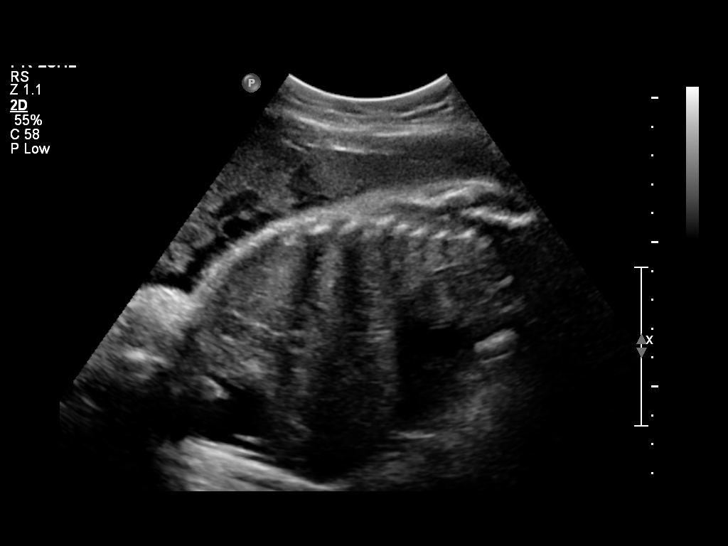
[im 9/10]
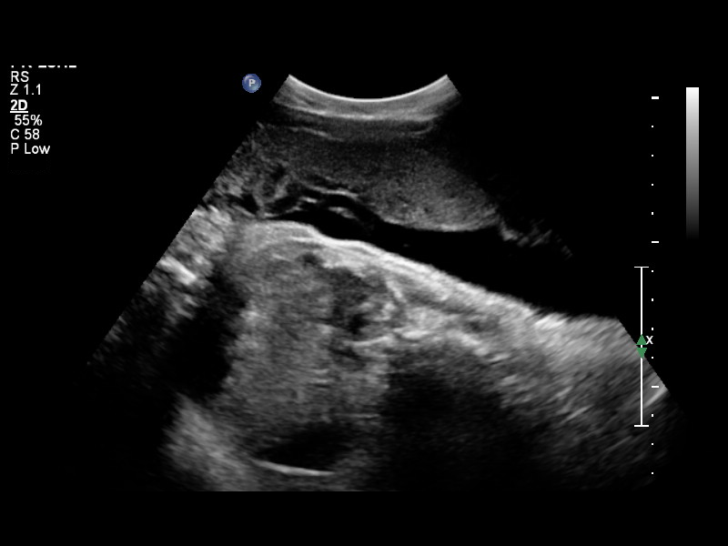
[im 10/10]
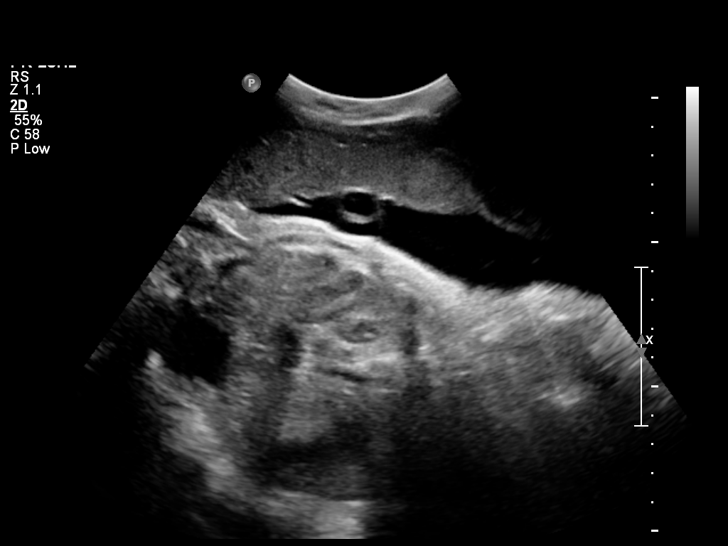

[11 of 11 positions shown; findings below may reference images not displayed]

FINDINGS: Single living intrauterine pregnancy noted with cardiac
activity 136 beats per minute and spontaneous limb and body
movement.  Cephalic presentation noted.  Placenta anterior.

Subjective assessment of amniotic fluid is normal, with a 7.5 cm
vertical pocket identified.

Biparietal diameter 8.7 cm compatible with 35 weeks 0 days
gestation.  Maternal cervix not assessed.  Maternal adnexa not
assessed.

Biophysical profile score over 27 minutes:

Movement:  2
Breathing:  2
Tone:  2
Amniotic fluid:  2
Total score:  [DATE]
IMPRESSION: 1.  Single living intrauterine pregnancy, with biophysical profile
score of [DATE].

## 2014-07-12 ENCOUNTER — Encounter (HOSPITAL_COMMUNITY): Payer: Self-pay | Admitting: *Deleted

## 2017-01-05 ENCOUNTER — Other Ambulatory Visit (HOSPITAL_COMMUNITY)
Admission: RE | Admit: 2017-01-05 | Discharge: 2017-01-05 | Disposition: A | Discharge status: Home / Self Care | Payer: BLUE CROSS/BLUE SHIELD | Source: Ambulatory Visit | Attending: Obstetrics and Gynecology | Admitting: Obstetrics and Gynecology

## 2017-01-05 DIAGNOSIS — N979 Female infertility, unspecified: Secondary | ICD-10-CM | POA: Diagnosis present

## 2017-01-06 LAB — PROGESTERONE: Progesterone: 19.5 ng/mL

## 2017-05-22 ENCOUNTER — Encounter: Payer: Self-pay | Admitting: Family Medicine

## 2017-05-22 ENCOUNTER — Ambulatory Visit (INDEPENDENT_AMBULATORY_CARE_PROVIDER_SITE_OTHER): Payer: BLUE CROSS/BLUE SHIELD | Admitting: Family Medicine

## 2017-05-22 VITALS — BP 126/78 | HR 88 | Resp 12 | Ht 65.0 in | Wt 206.4 lb

## 2017-05-22 DIAGNOSIS — Z6834 Body mass index (BMI) 34.0-34.9, adult: Secondary | ICD-10-CM

## 2017-05-22 DIAGNOSIS — E669 Obesity, unspecified: Secondary | ICD-10-CM | POA: Insufficient documentation

## 2017-05-22 DIAGNOSIS — Z319 Encounter for procreative management, unspecified: Secondary | ICD-10-CM

## 2017-05-22 DIAGNOSIS — E119 Type 2 diabetes mellitus without complications: Secondary | ICD-10-CM | POA: Diagnosis not present

## 2017-05-22 DIAGNOSIS — E6609 Other obesity due to excess calories: Secondary | ICD-10-CM | POA: Insufficient documentation

## 2017-05-22 LAB — MICROALBUMIN / CREATININE URINE RATIO
Creatinine,U: 206.8 mg/dL
MICROALB UR: 1.2 mg/dL (ref 0.0–1.9)
MICROALB/CREAT RATIO: 0.6 mg/g (ref 0.0–30.0)

## 2017-05-22 LAB — POCT GLYCOSYLATED HEMOGLOBIN (HGB A1C): HEMOGLOBIN A1C: 6.4

## 2017-05-22 NOTE — Patient Instructions (Signed)
A few things to remember from today's visit:   Diabetes mellitus type II, non insulin dependent ([place]) - Plan: POCT glycosylated hemoglobin (Hb A1C), Amb Referral to Nutrition and Diabetic E, Microalbumin / creatinine urine ratio  Diabetes Mellitus and Food It is important for you to manage your blood sugar (glucose) level. Your blood glucose level can be greatly affected by what you eat. Eating healthier foods in the appropriate amounts throughout the day at about the same time each day will help you control your blood glucose level. It can also help slow or prevent worsening of your diabetes mellitus. Healthy eating may even help you improve the level of your blood pressure and reach or maintain a healthy weight. General recommendations for healthful eating and cooking habits include:  Eating meals and snacks regularly. Avoid going long periods of time without eating to lose weight.  Eating a diet that consists mainly of plant-based foods, such as fruits, vegetables, nuts, legumes, and whole grains.  Using low-heat cooking methods, such as baking, instead of high-heat cooking methods, such as deep frying.  Work with your dietitian to make sure you understand how to use the Nutrition Facts information on food labels. How can food affect me? Carbohydrates Carbohydrates affect your blood glucose level more than any other type of food. Your dietitian will help you determine how many carbohydrates to eat at each meal and teach you how to count carbohydrates. Counting carbohydrates is important to keep your blood glucose at a healthy level, especially if you are using insulin or taking certain medicines for diabetes mellitus. Alcohol Alcohol can cause sudden decreases in blood glucose (hypoglycemia), especially if you use insulin or take certain medicines for diabetes mellitus. Hypoglycemia can be a life-threatening condition. Symptoms of hypoglycemia (sleepiness, dizziness, and disorientation) are  similar to symptoms of having too much alcohol. If your health care provider has given you approval to drink alcohol, do so in moderation and use the following guidelines:  Women should not have more than one drink per day, and men should not have more than two drinks per day. One drink is equal to: ? 12 oz of beer. ? 5 oz of wine. ? 1 oz of hard liquor.  Do not drink on an empty stomach.  Keep yourself hydrated. Have water, diet soda, or unsweetened iced tea.  Regular soda, juice, and other mixers might contain a lot of carbohydrates and should be counted.  What foods are not recommended? As you make food choices, it is important to remember that all foods are not the same. Some foods have fewer nutrients per serving than other foods, even though they might have the same number of calories or carbohydrates. It is difficult to get your body what it needs when you eat foods with fewer nutrients. Examples of foods that you should avoid that are high in calories and carbohydrates but low in nutrients include:  Trans fats (most processed foods list trans fats on the Nutrition Facts label).  Regular soda.  Juice.  Candy.  Sweets, such as cake, pie, doughnuts, and cookies.  Fried foods.  What foods can I eat? Eat nutrient-rich foods, which will nourish your body and keep you healthy. The food you should eat also will depend on several factors, including:  The calories you need.  The medicines you take.  Your weight.  Your blood glucose level.  Your blood pressure level.  Your cholesterol level.  You should eat a variety of foods, including:  Protein. ?  Lean cuts of meat. ? Proteins low in saturated fats, such as fish, egg whites, and beans. Avoid processed meats.  Fruits and vegetables. ? Fruits and vegetables that may help control blood glucose levels, such as apples, mangoes, and yams.  Dairy products. ? Choose fat-free or low-fat dairy products, such as milk,  yogurt, and cheese.  Grains, bread, pasta, and rice. ? Choose whole grain products, such as multigrain bread, whole oats, and brown rice. These foods may help control blood pressure.  Fats. ? Foods containing healthful fats, such as nuts, avocado, olive oil, canola oil, and fish.  Does everyone with diabetes mellitus have the same meal plan? Because every person with diabetes mellitus is different, there is not one meal plan that works for everyone. It is very important that you meet with a dietitian who will help you create a meal plan that is just right for you. This information is not intended to replace advice given to you by your health care provider. Make sure you discuss any questions you have with your health care provider. Document Released: 05/24/2005 Document Revised: 02/02/2016 Document Reviewed: 07/24/2013 Elsevier Interactive Patient Education  2017 [place].  Please be sure medication list is accurate. If a new problem present, please set up appointment sooner than planned today.
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
## 2017-06-11 ENCOUNTER — Encounter: Payer: BLUE CROSS/BLUE SHIELD | Attending: Family Medicine | Admitting: Dietician

## 2017-06-11 ENCOUNTER — Encounter: Payer: Self-pay | Admitting: Dietician

## 2017-06-11 DIAGNOSIS — Z713 Dietary counseling and surveillance: Secondary | ICD-10-CM | POA: Diagnosis not present

## 2017-06-11 DIAGNOSIS — E119 Type 2 diabetes mellitus without complications: Secondary | ICD-10-CM | POA: Diagnosis not present

## 2017-06-11 NOTE — Progress Notes (Signed)

## 2017-06-18 ENCOUNTER — Encounter: Payer: BLUE CROSS/BLUE SHIELD | Admitting: Dietician

## 2017-06-18 DIAGNOSIS — Z713 Dietary counseling and surveillance: Secondary | ICD-10-CM | POA: Diagnosis not present

## 2017-06-18 DIAGNOSIS — E119 Type 2 diabetes mellitus without complications: Secondary | ICD-10-CM

## 2017-06-20 NOTE — Progress Notes (Signed)
Patient was seen on 06/18/17 for the second of a series of three diabetes self-management courses at [facility]. The following learning objectives were met by the patient during this class:   Describe the role of different macronutrients on glucose  Explain how carbohydrates affect blood glucose  State what foods contain the most carbohydrates  Demonstrate carbohydrate counting  Demonstrate how to read Nutrition Facts food label  Describe effects of various fats on heart health  Describe the importance of good nutrition for health and healthy eating strategies  Describe techniques for managing your shopping, cooking and meal planning  List strategies to follow meal plan when dining out  Describe the effects of alcohol on glucose and how to use it safely  Goals:  Follow Diabetes Meal Plan as instructed  Aim to spread carbs evenly throughout the day  Aim for 3 meals per day and snacks as needed Include lean protein foods to meals/snacks  Monitor glucose levels as instructed by your doctor   Follow-Up Plan:  Attend Core 3  Work towards following your personal food plan.   

## 2017-06-25 ENCOUNTER — Encounter: Payer: BLUE CROSS/BLUE SHIELD | Admitting: Dietician

## 2017-06-25 DIAGNOSIS — Z713 Dietary counseling and surveillance: Secondary | ICD-10-CM | POA: Diagnosis not present

## 2017-06-25 DIAGNOSIS — E119 Type 2 diabetes mellitus without complications: Secondary | ICD-10-CM

## 2017-06-25 NOTE — Progress Notes (Signed)
Patient was seen on 06/25/17 for the third of a series of three diabetes self-management courses at the [facility].   Katherine Mitchell the amount of activity recommended for healthy living . Describe activities suitable for individual needs . Identify ways to regularly incorporate activity into daily life . Identify barriers to activity and ways to over come these barriers  Identify diabetes medications being personally used and their primary action for lowering glucose and possible side effects . Describe role of stress on blood glucose and develop strategies to address psychosocial issues . Identify diabetes complications and ways to prevent them  Explain how to manage diabetes during illness . Evaluate success in meeting personal goal . Establish 2-3 goals that they will plan to diligently work on until they return for the  20-monthfollow-up visit  Goals:   I will count my carb choices at most meals and snacks  I will be active 30 minutes or more 3 times a week  I will take my diabetes medications as scheduled  I will eat less unhealthy fats by eating less cake and ice cream and other high fat high carbohydrate foods.  Your patient has identified these potential barriers to change:  none  Your patient has identified their diabetes self-care support plan as  Family Support On-line Resources    Plan:  Attend Support Group as desired

## 2018-05-08 LAB — HM DIABETES EYE EXAM

## 2019-06-22 LAB — HM MAMMOGRAPHY

## 2019-06-30 LAB — HM MAMMOGRAPHY

## 2019-08-25 LAB — HM PAP SMEAR

## 2019-10-14 ENCOUNTER — Other Ambulatory Visit: Payer: Self-pay

## 2019-10-14 ENCOUNTER — Encounter: Payer: Self-pay | Admitting: Nurse Practitioner

## 2019-10-14 ENCOUNTER — Ambulatory Visit: Payer: 59 | Admitting: Nurse Practitioner

## 2019-10-14 VITALS — BP 116/72 | HR 98 | Temp 98.3°F | Ht 65.0 in | Wt 206.8 lb

## 2019-10-14 DIAGNOSIS — K51819 Other ulcerative colitis with unspecified complications: Secondary | ICD-10-CM

## 2019-10-14 DIAGNOSIS — Z0000 Encounter for general adult medical examination without abnormal findings: Secondary | ICD-10-CM | POA: Diagnosis not present

## 2019-10-14 DIAGNOSIS — E6609 Other obesity due to excess calories: Secondary | ICD-10-CM

## 2019-10-14 DIAGNOSIS — Z862 Personal history of diseases of the blood and blood-forming organs and certain disorders involving the immune mechanism: Secondary | ICD-10-CM

## 2019-10-14 DIAGNOSIS — R079 Chest pain, unspecified: Secondary | ICD-10-CM

## 2019-10-14 DIAGNOSIS — Z8632 Personal history of gestational diabetes: Secondary | ICD-10-CM | POA: Diagnosis not present

## 2019-10-14 DIAGNOSIS — Z6834 Body mass index (BMI) 34.0-34.9, adult: Secondary | ICD-10-CM | POA: Diagnosis not present

## 2019-10-14 LAB — POCT URINALYSIS DIPSTICK
Bilirubin, UA: NEGATIVE
Blood, UA: NEGATIVE
Glucose, UA: NEGATIVE
Ketones, UA: NEGATIVE
Leukocytes, UA: NEGATIVE
Nitrite, UA: NEGATIVE
Protein, UA: NEGATIVE
Spec Grav, UA: 1.025 (ref 1.010–1.025)
Urobilinogen, UA: 1 E.U./dL
pH, UA: 7 (ref 5.0–8.0)

## 2019-10-14 NOTE — Progress Notes (Addendum)
This visit occurred during the SARS-CoV-2 public health emergency.  Safety protocols were in place, including screening questions prior to the visit, additional usage of staff PPE, and extensive cleaning of exam room while observing appropriate contact time as indicated for disinfecting solutions.  Subjective:     Patient ID: Katherine Mitchell , female    DOB: [birth year] , 44 y.o.   MRN: [id number]   Chief Complaint  Patient presents with  . Establish Care    HPI  Here to establish care, she did not have a regular PCP, she found [place] with [facility].  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  Married.    PMH - gestational diabetes with all her children.  Last year had slightly elevated at 6.3. She was going to a fertility provider.  PCOS.  Ulcerative colitis (no issues in the last few years - [place]).    [facility] - mother - diabetes and multiple myeloma, father - unknown.  Has several aunts and uncles with diabetes.  Siblings - 1 brother and 1 sister -  healthy.   [facility] Dr. Mancel Bale - December 2020    Past Medical History:  Diagnosis Date  . Anemia    ULCERATIVE COLITIS  . Blood transfusion without reported diagnosis 2013   HAS HAD 2  . Gestational diabetes    glyburide; ALL PREGNANCIES  . Herpes   . History of maternal blood transfusion, currently pregnant   . Infection 2000   CHLAMYDIA  . Infection 2000   [place]  . Leiomyoma of uterus, unspecified 2009  . Maternal anemia, with delivery 11/14/2011  . Other venous complication of pregnancy and the puerperium, unspecified as to episode of care(671.80)    hemorrhoids  . PP care - s/p VBAC 3/4 11/12/2011  . Pregnancy with other poor obstetric history(V23.49)   . Previous cesarean delivery, unspecified as to episode of care or not applicable(654.20)   . Proteinuria   . Ulcerative colitis, unspecified   . VBAC, delivered, current hospitalization 11/26/2012     Family History  Problem Relation Age of Onset  .  Diabetes Mother   . Cancer Mother        MULTIPLE MYELOMA    No current outpatient medications on file.   No Known Allergies   Review of Systems  Constitutional: Negative.   HENT: Negative.   Eyes: Negative.   Respiratory: Negative.   Cardiovascular: Negative.  Negative for chest pain (previous throughout the year, "felt weird" did not have evaluated), palpitations and leg swelling.  Gastrointestinal: Negative.   Endocrine: Negative.   Genitourinary: Negative.   Musculoskeletal: Negative.   Skin: Negative.   Allergic/Immunologic: Negative.   Neurological: Negative.  Negative for dizziness and headaches.  Hematological: Negative.   Psychiatric/Behavioral: Negative.      Today's Vitals   10/14/19 1435  BP: 116/72  Pulse: 98  Temp: 98.3 F (36.8 C)  TempSrc: Oral  SpO2: 97%  Weight: 206 lb 12.8 oz (93.8 kg)  Height: _0  (1.651 m)  PainSc: 0-No pain   Body mass index is 34.41 kg/m.   Objective:  Physical Exam Constitutional:      General: She is not in acute distress.    Appearance: Normal appearance. She is obese.  HENT:     Head: Normocephalic and atraumatic.  Eyes:     Extraocular Movements: Extraocular movements intact.     Conjunctiva/sclera: Conjunctivae normal.     Pupils: Pupils are equal, round, and  reactive to light.  Cardiovascular:     Rate and Rhythm: Normal rate and regular rhythm.     Pulses: Normal pulses.     Heart sounds: Normal heart sounds. No murmur.  Pulmonary:     Effort: Pulmonary effort is normal. No respiratory distress.     Breath sounds: Normal breath sounds.  Abdominal:     General: Bowel sounds are normal. There is no distension.     Palpations: Abdomen is soft.  Musculoskeletal:        General: Normal range of motion.     Cervical back: Normal range of motion.  Skin:    General: Skin is warm.     Capillary Refill: Capillary refill takes less than 2 seconds.  Neurological:     General: No focal deficit present.      Mental Status: She is alert and oriented to person, place, and time.     Cranial Nerves: No cranial nerve deficit.  Psychiatric:        Mood and Affect: Mood normal.        Behavior: Behavior normal.        Thought Content: Thought content normal.        Judgment: Judgment normal.         Assessment And Plan:     1. Other ulcerative colitis with complication [facility])  Has seen Dr. Benson Norway in the past, plans to schedule an appt soon  No recent exacerbations.   2. Chest pain, unspecified type  No abnormal findings  EKG done NSR HR 77 - EKG 12-Lead  3. Health maintenance examination . Behavior modifications discussed and diet history reviewed.   . Pt will continue to exercise regularly and modify diet with low GI, plant based foods and decrease intake of processed foods.  . Recommend intake of daily multivitamin, Vitamin D, and calcium.  . Recommend mammogram (done at [facility] will await records) for preventive screenings, as well as recommend immunizations that include influenza (declined), TDAP - CMP14+EGFR - Lipid panel  4. History of anemia  - CBC  5. History of gestational diabetes  She reports she had a diabetic eye exam will get records from [facility]  Diabetic foot exam done with decreased sensation, discussed regular foot checks and avoiding walking barefoot. - Hemoglobin A1c  6. Class 1 obesity due to excess calories without serious comorbidity with body mass index (BMI) of 34.0 to 34.9 in adult Chronic Discussed healthy diet and regular exercise options  Encouraged to exercise at least 150 minutes per week with 2 days of strength training - TSH   [name], FNP    THE PATIENT IS ENCOURAGED TO PRACTICE SOCIAL DISTANCING DUE TO THE COVID-19 PANDEMIC.

## 2019-10-14 NOTE — Patient Instructions (Signed)
Health Maintenance, Female Adopting a healthy lifestyle and getting preventive care are important in promoting health and wellness. Ask your health care provider about:  The right schedule for you to have regular tests and exams.  Things you can do on your own to prevent diseases and keep yourself healthy. What should I know about diet, weight, and exercise? Eat a healthy diet   Eat a diet that includes plenty of vegetables, fruits, low-fat dairy products, and lean protein.  Do not eat a lot of foods that are high in solid fats, added sugars, or sodium. Maintain a healthy weight Body mass index (BMI) is used to identify weight problems. It estimates body fat based on height and weight. Your health care provider can help determine your BMI and help you achieve or maintain a healthy weight. Get regular exercise Get regular exercise. This is one of the most important things you can do for your health. Most adults should:  Exercise for at least 150 minutes each week. The exercise should increase your heart rate and make you sweat (moderate-intensity exercise).  Do strengthening exercises at least twice a week. This is in addition to the moderate-intensity exercise.  Spend less time sitting. Even light physical activity can be beneficial. Watch cholesterol and blood lipids Have your blood tested for lipids and cholesterol at 43 years of age, then have this test every 5 years. Have your cholesterol levels checked more often if:  Your lipid or cholesterol levels are high.  You are older than 43 years of age.  You are at high risk for heart disease. What should I know about cancer screening? Depending on your health history and family history, you may need to have cancer screening at various ages. This may include screening for:  Breast cancer.  Cervical cancer.  Colorectal cancer.  Skin cancer.  Lung cancer. What should I know about heart disease, diabetes, and high blood  pressure? Blood pressure and heart disease  High blood pressure causes heart disease and increases the risk of stroke. This is more likely to develop in people who have high blood pressure readings, are of African descent, or are overweight.  Have your blood pressure checked: ? Every 3-5 years if you are 18-39 years of age. ? Every year if you are 40 years old or older. Diabetes Have regular diabetes screenings. This checks your fasting blood sugar level. Have the screening done:  Once every three years after age 40 if you are at a normal weight and have a low risk for diabetes.  More often and at a younger age if you are overweight or have a high risk for diabetes. What should I know about preventing infection? Hepatitis B If you have a higher risk for hepatitis B, you should be screened for this virus. Talk with your health care provider to find out if you are at risk for hepatitis B infection. Hepatitis C Testing is recommended for:  Everyone born from 1945 through 1965.  Anyone with known risk factors for hepatitis C. Sexually transmitted infections (STIs)  Get screened for STIs, including gonorrhea and chlamydia, if: ? You are sexually active and are younger than 43 years of age. ? You are older than 43 years of age and your health care provider tells you that you are at risk for this type of infection. ? Your sexual activity has changed since you were last screened, and you are at increased risk for chlamydia or gonorrhea. Ask your health care provider if   you are at risk.  Ask your health care provider about whether you are at high risk for HIV. Your health care provider may recommend a prescription medicine to help prevent HIV infection. If you choose to take medicine to prevent HIV, you should first get tested for HIV. You should then be tested every 3 months for as long as you are taking the medicine. Pregnancy  If you are about to stop having your period (premenopausal) and  you may become pregnant, seek counseling before you get pregnant.  Take 400 to 800 micrograms (mcg) of folic acid every day if you become pregnant.  Ask for birth control (contraception) if you want to prevent pregnancy. Osteoporosis and menopause Osteoporosis is a disease in which the bones lose minerals and strength with aging. This can result in bone fractures. If you are 65 years old or older, or if you are at risk for osteoporosis and fractures, ask your health care provider if you should:  Be screened for bone loss.  Take a calcium or vitamin D supplement to lower your risk of fractures.  Be given hormone replacement therapy (HRT) to treat symptoms of menopause. Follow these instructions at home: Lifestyle  Do not use any products that contain nicotine or tobacco, such as cigarettes, e-cigarettes, and chewing tobacco. If you need help quitting, ask your health care provider.  Do not use street drugs.  Do not share needles.  Ask your health care provider for help if you need support or information about quitting drugs. Alcohol use  Do not drink alcohol if: ? Your health care provider tells you not to drink. ? You are pregnant, may be pregnant, or are planning to become pregnant.  If you drink alcohol: ? Limit how much you use to 0-1 drink a day. ? Limit intake if you are breastfeeding.  Be aware of how much alcohol is in your drink. In the [place] one drink equals one 12 oz bottle of beer (355 mL), one 5 oz glass of wine (148 mL), or one 1 oz glass of hard liquor (44 mL). General instructions  Schedule regular health, dental, and eye exams.  Stay current with your vaccines.  Tell your health care provider if: ? You often feel depressed. ? You have ever been abused or do not feel safe at home. Summary  Adopting a healthy lifestyle and getting preventive care are important in promoting health and wellness.  Follow your health care provider's instructions about healthy  diet, exercising, and getting tested or screened for diseases.  Follow your health care provider's instructions on monitoring your cholesterol and blood pressure. This information is not intended to replace advice given to you by your health care provider. Make sure you discuss any questions you have with your health care provider. Document Revised: 08/20/2018 Document Reviewed: 08/20/2018 Elsevier Patient Education  2020 [employer]  

## 2019-10-15 ENCOUNTER — Encounter: Payer: Self-pay | Admitting: Nurse Practitioner

## 2019-10-15 LAB — CMP14+EGFR
ALT: 10 IU/L (ref 0–32)
AST: 16 IU/L (ref 0–40)
Albumin/Globulin Ratio: 1.6 (ref 1.2–2.2)
Albumin: 4.2 g/dL (ref 3.8–4.8)
Alkaline Phosphatase: 71 IU/L (ref 39–117)
BUN/Creatinine Ratio: 19 (ref 9–23)
BUN: 12 mg/dL (ref 6–24)
Bilirubin Total: 0.5 mg/dL (ref 0.0–1.2)
CO2: 21 mmol/L (ref 20–29)
Calcium: 9.5 mg/dL (ref 8.7–10.2)
Chloride: 104 mmol/L (ref 96–106)
Creatinine, Ser: 0.64 mg/dL (ref 0.57–1.00)
GFR calc Af Amer: 127 mL/min/{1.73_m2} (ref >59)
GFR calc non Af Amer: 110 mL/min/{1.73_m2} (ref >59)
Globulin, Total: 2.7 g/dL (ref 1.5–4.5)
Glucose: 113 mg/dL — ABNORMAL HIGH (ref 65–99)
Potassium: 4.4 mmol/L (ref 3.5–5.2)
Sodium: 138 mmol/L (ref 134–144)
Total Protein: 6.9 g/dL (ref 6.0–8.5)

## 2019-10-15 LAB — CBC
Hematocrit: 38 % (ref 34.0–46.6)
Hemoglobin: 12.4 g/dL (ref 11.1–15.9)
MCH: 27.9 pg (ref 26.6–33.0)
MCHC: 32.6 g/dL (ref 31.5–35.7)
MCV: 86 fL (ref 79–97)
Platelets: 308 10*3/uL (ref 150–450)
RBC: 4.44 x10E6/uL (ref 3.77–5.28)
RDW: 13.1 % (ref 11.7–15.4)
WBC: 5.9 10*3/uL (ref 3.4–10.8)

## 2019-10-15 LAB — LIPID PANEL
Chol/HDL Ratio: 3.8 ratio (ref 0.0–4.4)
Cholesterol, Total: 173 mg/dL (ref 100–199)
HDL: 46 mg/dL (ref >39)
LDL Chol Calc (NIH): 116 mg/dL — ABNORMAL HIGH (ref 0–99)
Triglycerides: 54 mg/dL (ref 0–149)
VLDL Cholesterol Cal: 11 mg/dL (ref 5–40)

## 2019-10-15 LAB — TSH: TSH: 1.12 u[IU]/mL (ref 0.450–4.500)

## 2019-10-15 LAB — HEMOGLOBIN A1C
Est. average glucose Bld gHb Est-mCnc: 137 mg/dL
Hgb A1c MFr Bld: 6.4 % — ABNORMAL HIGH (ref 4.8–5.6)

## 2019-11-09 ENCOUNTER — Encounter: Payer: Self-pay | Admitting: Nurse Practitioner

## 2019-11-26 ENCOUNTER — Other Ambulatory Visit: Payer: Self-pay

## 2019-11-26 ENCOUNTER — Encounter: Payer: Self-pay | Admitting: Nurse Practitioner

## 2019-11-26 ENCOUNTER — Ambulatory Visit: Payer: 59 | Admitting: Nurse Practitioner

## 2019-11-26 ENCOUNTER — Other Ambulatory Visit (HOSPITAL_COMMUNITY)
Admission: RE | Admit: 2019-11-26 | Discharge: 2019-11-26 | Disposition: A | Discharge status: Home / Self Care | Payer: 59 | Source: Ambulatory Visit | Attending: Nurse Practitioner | Admitting: Nurse Practitioner

## 2019-11-26 VITALS — BP 118/76 | HR 104 | Temp 98.8°F | Ht 64.6 in | Wt 205.2 lb

## 2019-11-26 DIAGNOSIS — N760 Acute vaginitis: Secondary | ICD-10-CM | POA: Diagnosis not present

## 2019-11-26 MED ORDER — METRONIDAZOLE 500 MG PO TABS: 500.0000 mg | ORAL_TABLET | Freq: Three times a day (TID) | ORAL | 0 refills | Status: AC

## 2019-11-26 MED ORDER — FLUCONAZOLE 100 MG PO TABS: 100.0000 mg | ORAL_TABLET | Freq: Every day | ORAL | 0 refills | Status: DC

## 2019-11-26 NOTE — Progress Notes (Signed)
This visit occurred during the SARS-CoV-2 public health emergency.  Safety protocols were in place, including screening questions prior to the visit, additional usage of staff PPE, and extensive cleaning of exam room while observing appropriate contact time as indicated for disinfecting solutions.  Subjective:     Patient ID: Katherine Mitchell , female    DOB: 1976/08/25 , 44 y.o.   MRN: [id number]   Chief Complaint  Patient presents with  . vaginal irritation    HPI  She is having vaginal irritation and swelling since Sunday.  Now has vaginal discharge white and thick.  No vaginal bleeding. LMP - February 21st.  Sexually active - one partner. She does report using a new lubricant with her husband    Past Medical History:  Diagnosis Date  . Anemia    ULCERATIVE COLITIS  . Blood transfusion without reported diagnosis 2013   HAS HAD 2  . Gestational diabetes    glyburide; ALL PREGNANCIES  . Herpes   . History of maternal blood transfusion, currently pregnant   . Infection 2000   CHLAMYDIA  . Infection 2000   [place]  . Leiomyoma of uterus, unspecified 2009  . Maternal anemia, with delivery 11/14/2011  . Other venous complication of pregnancy and the puerperium, unspecified as to episode of care(671.80)    hemorrhoids  . PP care - s/p VBAC 3/4 11/12/2011  . Pregnancy with other poor obstetric history(V23.49)   . Previous cesarean delivery, unspecified as to episode of care or not applicable(654.20)   . Proteinuria   . Ulcerative colitis, unspecified   . VBAC, delivered, current hospitalization 11/26/2012     Family History  Problem Relation Age of Onset  . Diabetes Mother   . Cancer Mother        MULTIPLE MYELOMA    No current outpatient medications on file.   No Known Allergies   Review of Systems   Today's Vitals   11/26/19 1526  BP: 118/76  Pulse: (!) 104  Temp: 98.8 F (37.1 C)  TempSrc: Oral  SpO2: 96%  Weight: 205 lb 3.2 oz (93.1 kg)  Height: 5' 4.6"  (1.641 m)   Body mass index is 34.57 kg/m.   Objective:  Physical Exam Constitutional:      Appearance: Normal appearance.  Cardiovascular:     Rate and Rhythm: Normal rate and regular rhythm.     Pulses: Normal pulses.     Heart sounds: Normal heart sounds. No murmur.  Pulmonary:     Effort: Pulmonary effort is normal. No respiratory distress.     Breath sounds: Normal breath sounds.  Genitourinary:    Vagina: Vaginal discharge (pale yellow vaginal discharge) present.  Skin:    General: Skin is warm.     Capillary Refill: Capillary refill takes less than 2 seconds.  Neurological:     General: No focal deficit present.     Mental Status: She is alert.  Psychiatric:        Mood and Affect: Mood normal.        Behavior: Behavior normal.        Thought Content: Thought content normal.        Judgment: Judgment normal.         Assessment And Plan:     1. Acute vaginitis Pale yellow vaginal discharge present to vaginal vault will treat for yeast and possible bacterial vaginosis. Will send sample to check for STDs Advised to avoid using scented soaps and gels to vagina - metroNIDAZOLE (  FLAGYL) 500 MG tablet; Take 1 tablet (500 mg total) by mouth 3 (three) times daily for 7 days.  Dispense: 21 tablet; Refill: 0 - fluconazole (DIFLUCAN) 100 MG tablet; Take 1 tablet (100 mg total) by mouth daily. Take 1 tablet by mouth now repeat in 5 days  Dispense: 2 tablet; Refill: 0 - Cervicovaginal ancillary only   [name], FNP    THE PATIENT IS ENCOURAGED TO PRACTICE SOCIAL DISTANCING DUE TO THE COVID-19 PANDEMIC.

## 2019-11-26 NOTE — Patient Instructions (Signed)
Vaginitis Vaginitis is a condition in which the vaginal tissue swells and becomes red (inflamed). This condition is most often caused by a change in the normal balance of bacteria and yeast that live in the vagina. This change causes an overgrowth of certain bacteria or yeast, which causes the inflammation. There are different types of vaginitis, but the most common types are:  Bacterial vaginosis.  Yeast infection (candidiasis).  Trichomoniasis vaginitis. This is a sexually transmitted disease (STD).  Viral vaginitis.  Atrophic vaginitis.  Allergic vaginitis. What are the causes? The cause of this condition depends on the type of vaginitis. It can be caused by:  Bacteria (bacterial vaginosis).  Yeast, which is a fungus (yeast infection).  A parasite (trichomoniasis vaginitis).  A virus (viral vaginitis).  Low hormone levels (atrophic vaginitis). Low hormone levels can occur during pregnancy, breastfeeding, or after menopause.  Irritants, such as bubble baths, scented tampons, and feminine sprays (allergic vaginitis). Other factors can change the normal balance of the yeast and bacteria that live in the vagina. These include:  Antibiotic medicines.  Poor hygiene.  Diaphragms, vaginal sponges, spermicides, birth control pills, and intrauterine devices (IUD).  Sex.  Infection.  Uncontrolled diabetes.  A weakened defense (immune) system. What increases the risk? This condition is more likely to develop in women who:  Smoke.  Use vaginal douches, scented tampons, or scented sanitary pads.  Wear tight-fitting pants.  Wear thong underwear.  Use oral birth control pills or an IUD.  Have sex without a condom.  Have multiple sex partners.  Have an STD.  Frequently use the spermicide nonoxynol-9.  Eat lots of foods high in sugar.  Have uncontrolled diabetes.  Have low estrogen levels.  Have a weakened immune system from an immune disorder or medical  treatment.  Are pregnant or breastfeeding. What are the signs or symptoms? Symptoms vary depending on the cause of the vaginitis. Common symptoms include:  Abnormal vaginal discharge. ? The discharge is white, gray, or yellow with bacterial vaginosis. ? The discharge is thick, white, and cheesy with a yeast infection. ? The discharge is frothy and yellow or greenish with trichomoniasis.  A bad vaginal smell. The smell is fishy with bacterial vaginosis.  Vaginal itching, pain, or swelling.  Sex that is painful.  Pain or burning when urinating. Sometimes there are no symptoms. How is this diagnosed? This condition is diagnosed based on your symptoms and medical history. A physical exam, including a pelvic exam, will also be done. You may also have other tests, including:  Tests to determine the pH level (acidity or alkalinity) of your vagina.  A whiff test, to assess the odor that results when a sample of your vaginal discharge is mixed with a potassium hydroxide solution.  Tests of vaginal fluid. A sample will be examined under a microscope. How is this treated? Treatment varies depending on the type of vaginitis you have. Your treatment may include:  Antibiotic creams or pills to treat bacterial vaginosis and trichomoniasis.  Antifungal medicines, such as vaginal creams or suppositories, to treat a yeast infection.  Medicine to ease discomfort if you have viral vaginitis. Your sexual partner should also be treated.  Estrogen delivered in a cream, pill, suppository, or vaginal ring to treat atrophic vaginitis. If vaginal dryness occurs, lubricants and moisturizing creams may help. You may need to avoid scented soaps, sprays, or douches.  Stopping use of a product that is causing allergic vaginitis. Then using a vaginal cream to treat the symptoms. Follow   these instructions at home: Lifestyle  Keep your genital area clean and dry. Avoid soap, and only rinse the area with  water.  Do not douche or use tampons until your health care provider says it is okay to do so. Use sanitary pads, if needed.  Do not have sex until your health care provider approves. When you can return to sex, practice safe sex and use condoms.  Wipe from front to back. This avoids the spread of bacteria from the rectum to the vagina. General instructions  Take over-the-counter and prescription medicines only as told by your health care provider.  If you were prescribed an antibiotic medicine, take or use it as told by your health care provider. Do not stop taking or using the antibiotic even if you start to feel better.  Keep all follow-up visits as told by your health care provider. This is important. How is this prevented?  Use mild, non-scented products. Do not use things that can irritate the vagina, such as fabric softeners. Avoid the following products if they are scented: ? Feminine sprays. ? Detergents. ? Tampons. ? Feminine hygiene products. ? Soaps or bubble baths.  Let air reach your genital area. ? Wear cotton underwear to reduce moisture buildup. ? Avoid wearing underwear while you sleep. ? Avoid wearing tight pants and underwear or nylons without a cotton panel. ? Avoid wearing thong underwear.  Take off any wet clothing, such as bathing suits, as soon as possible.  Practice safe sex and use condoms. Contact a health care provider if:  You have abdominal pain.  You have a fever.  You have symptoms that last for more than 2-3 days. Get help right away if:  You have a fever and your symptoms suddenly get worse. Summary  Vaginitis is a condition in which the vaginal tissue becomes inflamed.This condition is most often caused by a change in the normal balance of bacteria and yeast that live in the vagina.  Treatment varies depending on the type of vaginitis you have.  Do not douche, use tampons , or have sex until your health care provider approves. When  you can return to sex, practice safe sex and use condoms. This information is not intended to replace advice given to you by your health care provider. Make sure you discuss any questions you have with your health care provider. Document Revised: 08/09/2017 Document Reviewed: 10/02/2016 Elsevier Patient Education  2020 [employer]  

## 2019-11-30 LAB — CERVICOVAGINAL ANCILLARY ONLY
Bacterial Vaginitis (gardnerella): POSITIVE — AB
Candida Glabrata: NEGATIVE
Candida Vaginitis: POSITIVE — AB
Chlamydia: NEGATIVE
Comment: NEGATIVE
Comment: NEGATIVE
Comment: NEGATIVE
Comment: NEGATIVE
Comment: NEGATIVE
Comment: NORMAL
Neisseria Gonorrhea: NEGATIVE
Trichomonas: NEGATIVE

## 2020-01-11 ENCOUNTER — Encounter: Payer: Self-pay | Admitting: Nurse Practitioner

## 2020-01-11 ENCOUNTER — Ambulatory Visit: Payer: 59 | Admitting: Nurse Practitioner

## 2020-01-11 ENCOUNTER — Other Ambulatory Visit: Payer: Self-pay

## 2020-01-11 VITALS — BP 122/80 | HR 76 | Temp 98.1°F | Ht 63.8 in | Wt 201.6 lb

## 2020-01-11 DIAGNOSIS — E7800 Pure hypercholesterolemia, unspecified: Secondary | ICD-10-CM | POA: Diagnosis not present

## 2020-01-11 DIAGNOSIS — R7303 Prediabetes: Secondary | ICD-10-CM

## 2020-01-11 LAB — POCT UA - MICROALBUMIN
Albumin/Creatinine Ratio, Urine, POC: 30
Creatinine, POC: 200 mg/dL
Microalbumin Ur, POC: 30 mg/L

## 2020-01-11 NOTE — Addendum Note (Signed)
Addended by: Luana Shu on: 01/11/2020 05:34 PM   Modules accepted: Orders

## 2020-01-11 NOTE — Progress Notes (Signed)
This visit occurred during the SARS-CoV-2 public health emergency.  Safety protocols were in place, including screening questions prior to the visit, additional usage of staff PPE, and extensive cleaning of exam room while observing appropriate contact time as indicated for disinfecting solutions.  Subjective:     Patient ID: Katherine Mitchell , female    DOB: November 18, 1975 , 44 y.o.   MRN: [id number]   Chief Complaint  Patient presents with  . Follow-up    Lab Work - A1C    HPI  She is here today to follow up on her HgbA1c.  She is exercising more and avoiding sugary foods for the last 2 months.  Wt Readings from Last 3 Encounters: 01/11/20 : 201 lb 9.6 oz (91.4 kg) 11/26/19 : 205 lb 3.2 oz (93.1 kg) 10/14/19 : 206 lb 12.8 oz (93.8 kg)  She had gestational diabetes with all 3 of her children.  She did have to take metformin with her first child.  Mother - history of diabetes.      Past Medical History:  Diagnosis Date  . Anemia    ULCERATIVE COLITIS  . Blood transfusion without reported diagnosis 2013   HAS HAD 2  . Gestational diabetes    glyburide; ALL PREGNANCIES  . Herpes   . History of maternal blood transfusion, currently pregnant   . Infection 2000   CHLAMYDIA  . Infection 2000   [place]  . Leiomyoma of uterus, unspecified 2009  . Maternal anemia, with delivery 11/14/2011  . Other venous complication of pregnancy and the puerperium, unspecified as to episode of care(671.80)    hemorrhoids  . PP care - s/p VBAC 3/4 11/12/2011  . Pregnancy with other poor obstetric history(V23.49)   . Previous cesarean delivery, unspecified as to episode of care or not applicable(654.20)   . Proteinuria   . Ulcerative colitis, unspecified   . VBAC, delivered, current hospitalization 11/26/2012     Family History  Problem Relation Age of Onset  . Diabetes Mother   . Cancer Mother        MULTIPLE MYELOMA     Current Outpatient Medications:  .  fluconazole (DIFLUCAN) 100 MG  tablet, Take 1 tablet (100 mg total) by mouth daily. Take 1 tablet by mouth now repeat in 5 days (Patient not taking: Reported on 01/11/2020), Disp: 2 tablet, Rfl: 0   No Known Allergies   Review of Systems  Constitutional: Negative.  Negative for fatigue.  Respiratory: Negative.   Cardiovascular: Negative.  Negative for chest pain, palpitations and leg swelling.  Gastrointestinal: Negative.   Endocrine: Negative.  Negative for polydipsia, polyphagia and polyuria.  Skin: Negative.   Neurological: Negative.  Negative for dizziness.  Psychiatric/Behavioral: Negative.  Negative for confusion. The patient is not nervous/anxious.      Today's Vitals   01/11/20 1409  BP: 122/80  Pulse: 76  Temp: 98.1 F (36.7 C)  Weight: 201 lb 9.6 oz (91.4 kg)  Height: 5' 3.8" (1.621 m)   Body mass index is 34.82 kg/m.   Objective:  Physical Exam Constitutional:      General: She is not in acute distress.    Appearance: Normal appearance. She is well-developed. She is obese.  Cardiovascular:     Rate and Rhythm: Normal rate and regular rhythm.     Pulses: Normal pulses.     Heart sounds: Normal heart sounds. No murmur.  Pulmonary:     Effort: Pulmonary effort is normal. No respiratory distress.     Breath  sounds: Normal breath sounds.  Chest:     Chest wall: No tenderness.  Musculoskeletal:        General: Normal range of motion.  Skin:    General: Skin is warm and dry.     Capillary Refill: Capillary refill takes less than 2 seconds.  Neurological:     General: No focal deficit present.     Mental Status: She is alert and oriented to person, place, and time.  Psychiatric:        Mood and Affect: Mood normal.        Behavior: Behavior normal.        Thought Content: Thought content normal.        Judgment: Judgment normal.         Assessment And Plan:     1. Prediabetes  She has lost approximately 5 lbs since her last office visit  She has cut back on sugary foods/drinks and  exercising more my thoughts would be her Hgb1c is improved  Will check Hgb1c, if remains elevated will start on Rybelsus 3 mg daily  I will also refer to ophthalmology due  - Ambulatory referral to Ophthalmology - Hemoglobin A1c  2. Elevated cholesterol  LDL was slightly elevated  Will recheck lipid panel today  - Lipid panel   [name], FNP    THE PATIENT IS ENCOURAGED TO PRACTICE SOCIAL DISTANCING DUE TO THE COVID-19 PANDEMIC.

## 2020-01-12 LAB — LIPID PANEL
Chol/HDL Ratio: 4 ratio (ref 0.0–4.4)
Cholesterol, Total: 186 mg/dL (ref 100–199)
HDL: 47 mg/dL (ref >39)
LDL Chol Calc (NIH): 124 mg/dL — ABNORMAL HIGH (ref 0–99)
Triglycerides: 79 mg/dL (ref 0–149)
VLDL Cholesterol Cal: 15 mg/dL (ref 5–40)

## 2020-01-12 LAB — HEMOGLOBIN A1C
Est. average glucose Bld gHb Est-mCnc: 131 mg/dL
Hgb A1c MFr Bld: 6.2 % — ABNORMAL HIGH (ref 4.8–5.6)

## 2020-01-14 ENCOUNTER — Encounter: Payer: Self-pay | Admitting: Nurse Practitioner

## 2020-01-21 ENCOUNTER — Encounter: Payer: Self-pay | Admitting: Nurse Practitioner

## 2020-01-25 ENCOUNTER — Ambulatory Visit: Payer: Self-pay | Admitting: Nurse Practitioner

## 2020-01-25 ENCOUNTER — Encounter: Payer: Self-pay | Admitting: Nurse Practitioner

## 2020-01-25 LAB — HM DIABETES EYE EXAM

## 2020-01-26 ENCOUNTER — Other Ambulatory Visit (HOSPITAL_COMMUNITY)
Admission: RE | Admit: 2020-01-26 | Discharge: 2020-01-26 | Disposition: A | Discharge status: Home / Self Care | Payer: 59 | Source: Ambulatory Visit | Attending: Nurse Practitioner | Admitting: Nurse Practitioner

## 2020-01-26 ENCOUNTER — Other Ambulatory Visit: Payer: Self-pay

## 2020-01-26 ENCOUNTER — Encounter: Payer: Self-pay | Admitting: Nurse Practitioner

## 2020-01-26 ENCOUNTER — Ambulatory Visit: Payer: 59 | Admitting: Nurse Practitioner

## 2020-01-26 VITALS — BP 120/62 | HR 85 | Temp 98.7°F | Ht 63.8 in | Wt 205.0 lb

## 2020-01-26 DIAGNOSIS — N760 Acute vaginitis: Secondary | ICD-10-CM | POA: Insufficient documentation

## 2020-01-26 MED ORDER — TINIDAZOLE 500 MG PO TABS
ORAL_TABLET | ORAL | 0 refills | Status: DC
Start: 2020-01-26 — End: 2020-05-17

## 2020-01-26 MED ORDER — FLUCONAZOLE 100 MG PO TABS: 100.0000 mg | ORAL_TABLET | Freq: Every day | ORAL | 0 refills | Status: DC

## 2020-01-26 NOTE — Addendum Note (Signed)
Addended by: [name] F on: 01/26/2020 04:59 PM   Modules accepted: Orders

## 2020-01-26 NOTE — Progress Notes (Signed)
This visit occurred during the SARS-CoV-2 public health emergency.  Safety protocols were in place, including screening questions prior to the visit, additional usage of staff PPE, and extensive cleaning of exam room while observing appropriate contact time as indicated for disinfecting solutions.  Subjective:     Patient ID: Katherine Mitchell , female    DOB: 1976/06/12 , 44 y.o.   MRN: [id number]   Chief Complaint  Patient presents with  . Vaginitis    HPI  She is having vaginal irritation and swelling since Sunday.  Now has vaginal discharge white and thick.  No vaginal bleeding. LMP - February 21st.  Sexually active - one partner. She does report using a new lubricant with her husband  She went hiking on Saturday afterwards took a bath with epsom salt and bubble bath, intercourse was uncomfortable. As soon as they were finished had burning sensation. Minimal amount of discharge. She had swelling and redness. Vaginal Discharge The patient's primary symptoms include vaginal discharge. The patient's pertinent negatives include no genital itching. This is a recurrent problem. The current episode started in the past 7 days. The problem occurs constantly. She is not pregnant. Associated symptoms include painful intercourse. Pertinent negatives include no abdominal pain, constipation, frequency, headaches or nausea. The vaginal discharge was white and thick. There has been no bleeding. She has not been passing clots. She has not been passing tissue. She is sexually active. No, her partner does not have an STD. There is no history of an abdominal surgery or an STD.     Past Medical History:  Diagnosis Date  . Anemia    ULCERATIVE COLITIS  . Blood transfusion without reported diagnosis 2013   HAS HAD 2  . Gestational diabetes    glyburide; ALL PREGNANCIES  . Herpes   . History of maternal blood transfusion, currently pregnant   . Infection 2000   CHLAMYDIA  . Infection 2000   [place]  .  Leiomyoma of uterus, unspecified 2009  . Maternal anemia, with delivery 11/14/2011  . Other venous complication of pregnancy and the puerperium, unspecified as to episode of care(671.80)    hemorrhoids  . PP care - s/p VBAC 3/4 11/12/2011  . Pregnancy with other poor obstetric history(V23.49)   . Previous cesarean delivery, unspecified as to episode of care or not applicable(654.20)   . Proteinuria   . Ulcerative colitis, unspecified   . VBAC, delivered, current hospitalization 11/26/2012     Family History  Problem Relation Age of Onset  . Diabetes Mother   . Cancer Mother        MULTIPLE MYELOMA    No current outpatient medications on file.   No Known Allergies   Review of Systems  Constitutional: Negative.   Respiratory: Negative.   Cardiovascular: Negative.  Negative for chest pain, palpitations and leg swelling.  Gastrointestinal: Negative for abdominal pain, constipation and nausea.  Genitourinary: Positive for vaginal discharge. Negative for frequency.  Neurological: Negative for headaches.  Psychiatric/Behavioral: Negative.      Today's Vitals   01/26/20 0934  BP: 120/62  Pulse: 85  Temp: 98.7 F (37.1 C)  TempSrc: Oral  Weight: 205 lb (93 kg)  Height: 5' 3.8" (1.621 m)   Body mass index is 35.41 kg/m.   Objective:  Physical Exam Constitutional:      Appearance: Normal appearance.  Cardiovascular:     Rate and Rhythm: Normal rate and regular rhythm.     Pulses: Normal pulses.     Heart sounds:  Normal heart sounds. No murmur.  Pulmonary:     Effort: Pulmonary effort is normal. No respiratory distress.     Breath sounds: Normal breath sounds.  Genitourinary:    Vagina: Vaginal discharge (pale yellow vaginal discharge) present.  Skin:    General: Skin is warm.     Capillary Refill: Capillary refill takes less than 2 seconds.  Neurological:     General: No focal deficit present.     Mental Status: She is alert.  Psychiatric:        Mood and Affect:  Mood normal.        Behavior: Behavior normal.        Thought Content: Thought content normal.        Judgment: Judgment normal.         Assessment And Plan:     1. Acute vaginitis  She does have a small amount of vaginal discharge   Repeat cytology swab done, I will treat with tinidazole and diflucan pending results.   Discussed taking probiotic daily to help with vaginal pH.    [name], FNP    THE PATIENT IS ENCOURAGED TO PRACTICE SOCIAL DISTANCING DUE TO THE COVID-19 PANDEMIC.

## 2020-01-28 LAB — CERVICOVAGINAL ANCILLARY ONLY
Bacterial Vaginitis (gardnerella): NEGATIVE
Candida Glabrata: NEGATIVE
Candida Vaginitis: POSITIVE — AB
Chlamydia: NEGATIVE
Comment: NEGATIVE
Comment: NEGATIVE
Comment: NEGATIVE
Comment: NEGATIVE
Comment: NEGATIVE
Comment: NORMAL
Neisseria Gonorrhea: NEGATIVE
Trichomonas: NEGATIVE

## 2020-04-12 ENCOUNTER — Encounter: Payer: Self-pay | Admitting: Nurse Practitioner

## 2020-04-12 ENCOUNTER — Ambulatory Visit: Payer: 59 | Admitting: Nurse Practitioner

## 2020-04-12 LAB — HM COLONOSCOPY

## 2020-04-15 ENCOUNTER — Encounter: Payer: Self-pay | Admitting: Nurse Practitioner

## 2020-04-19 ENCOUNTER — Encounter: Payer: Self-pay | Admitting: Nurse Practitioner

## 2020-05-17 ENCOUNTER — Encounter: Payer: Self-pay | Admitting: Nurse Practitioner

## 2020-05-17 ENCOUNTER — Other Ambulatory Visit: Payer: Self-pay

## 2020-05-17 ENCOUNTER — Ambulatory Visit (INDEPENDENT_AMBULATORY_CARE_PROVIDER_SITE_OTHER): Payer: 59 | Admitting: Nurse Practitioner

## 2020-05-17 VITALS — BP 118/76 | Temp 98.2°F | Ht 63.8 in | Wt 209.0 lb

## 2020-05-17 DIAGNOSIS — R7303 Prediabetes: Secondary | ICD-10-CM | POA: Diagnosis not present

## 2020-05-17 DIAGNOSIS — Z1159 Encounter for screening for other viral diseases: Secondary | ICD-10-CM

## 2020-05-17 DIAGNOSIS — Z6836 Body mass index (BMI) 36.0-36.9, adult: Secondary | ICD-10-CM | POA: Diagnosis not present

## 2020-05-17 DIAGNOSIS — N760 Acute vaginitis: Secondary | ICD-10-CM | POA: Diagnosis not present

## 2020-05-17 LAB — POCT URINALYSIS DIPSTICK
Bilirubin, UA: NEGATIVE
Blood, UA: NEGATIVE
Glucose, UA: POSITIVE — AB
Leukocytes, UA: NEGATIVE
Nitrite, UA: NEGATIVE
Protein, UA: NEGATIVE
Spec Grav, UA: >=1.03 — AB (ref 1.010–1.025)
Urobilinogen, UA: 0.2 E.U./dL
pH, UA: 5.5 (ref 5.0–8.0)

## 2020-05-17 MED ORDER — FLUCONAZOLE 100 MG PO TABS: 100.0000 mg | ORAL_TABLET | Freq: Every day | ORAL | 0 refills | Status: DC

## 2020-05-17 MED ORDER — RYBELSUS 7 MG PO TABS: 1.0000 | ORAL_TABLET | Freq: Every day | ORAL | 2 refills | Status: DC

## 2020-05-17 MED ORDER — RYBELSUS 3 MG PO TABS: 1.0000 | ORAL_TABLET | Freq: Every day | ORAL | 3 refills | Status: DC

## 2020-05-17 NOTE — Progress Notes (Signed)
Rutherford Nail as a scribe for [name], FNP.,have documented all relevant documentation on the behalf of [name], FNP,as directed by  [name], FNP while in the presence of [name], [place].  This visit occurred during the SARS-CoV-2 public health emergency.  Safety protocols were in place, including screening questions prior to the visit, additional usage of staff PPE, and extensive cleaning of exam room while observing appropriate contact time as indicated for disinfecting solutions.  Subjective:     Patient ID: Katherine Mitchell , female    DOB: [birth year] , 44 y.o.   MRN: [id number]   Chief Complaint  Patient presents with  . Diabetes    HPI  Pt presents today for diabetes check   Wt Readings from Last 3 Encounters: 05/17/20 : 209 lb (94.8 kg) 01/26/20 : 205 lb (93 kg) 01/11/20 : 201 lb 9.6 oz (91.4 kg)  She had gestational diabetes with all 3 of her children.  She did have to take metformin with her first child and fertility issues. She feels like she had a "weird taste in her mouth".  Mother - history of diabetes.   Diabetes She presents for her follow-up diabetic visit. Diabetes type: prediabetes. There are no hypoglycemic associated symptoms. Pertinent negatives for hypoglycemia include no dizziness or headaches. There are no diabetic associated symptoms. Pertinent negatives for diabetes include no chest pain, no polydipsia, no polyphagia and no polyuria. There are no hypoglycemic complications. Symptoms are stable. There are no diabetic complications. Risk factors for coronary artery disease include obesity and sedentary lifestyle. Current diabetic treatment includes oral agent (monotherapy). She is compliant with treatment all of the time. There is no change in her home blood glucose trend.  Vaginal Itching The patient's pertinent negatives include no genital itching. Primary symptoms comment: more vaginal irritation. This is a recurrent problem. She is  not pregnant. Pertinent negatives include no headaches. Nothing aggravates the symptoms.     Past Medical History:  Diagnosis Date  . Anemia    ULCERATIVE COLITIS  . Blood transfusion without reported diagnosis 2013   HAS HAD 2  . Gestational diabetes    glyburide; ALL PREGNANCIES  . Herpes   . History of maternal blood transfusion, currently pregnant   . Infection 2000   CHLAMYDIA  . Infection 2000   [place]  . Leiomyoma of uterus, unspecified 2009  . Maternal anemia, with delivery 11/14/2011  . Other venous complication of pregnancy and the puerperium, unspecified as to episode of care(671.80)    hemorrhoids  . PP care - s/p VBAC 3/4 11/12/2011  . Pregnancy with other poor obstetric history(V23.49)   . Previous cesarean delivery, unspecified as to episode of care or not applicable(654.20)   . Proteinuria   . Ulcerative colitis, unspecified   . VBAC, delivered, current hospitalization 11/26/2012     Family History  Problem Relation Age of Onset  . Diabetes Mother   . Cancer Mother        MULTIPLE MYELOMA     Current Outpatient Medications:  .  fluconazole (DIFLUCAN) 100 MG tablet, Take 1 tablet (100 mg total) by mouth daily. Take 1 tablet by mouth now repeat in 5 days, Disp: 2 tablet, Rfl: 0 .  Semaglutide (RYBELSUS) 7 MG TABS, Take 1 tablet by mouth daily. Take 30 minutes before breakfast, Disp: 30 tablet, Rfl: 2   No Known Allergies   Review of Systems  Constitutional: Negative.   Respiratory: Negative.  Negative for cough.   Cardiovascular: Negative.  Negative for chest pain, palpitations and leg swelling.  Endocrine: Negative for polydipsia, polyphagia and polyuria.  Genitourinary: Negative.   Skin: Negative.   Neurological: Negative.  Negative for dizziness and headaches.  Psychiatric/Behavioral: Negative.      Today's Vitals   05/17/20 1151  BP: 118/76  Temp: 98.2 F (36.8 C)  TempSrc: Oral  Weight: 209 lb (94.8 kg)  Height: 5' 3.8" (1.621 m)  PainSc:  0-No pain   Body mass index is 36.1 kg/m.   Objective:  Physical Exam Constitutional:      General: She is not in acute distress.    Appearance: Normal appearance.  Cardiovascular:     Rate and Rhythm: Normal rate and regular rhythm.     Pulses: Normal pulses.     Heart sounds: Normal heart sounds. No murmur heard.   Genitourinary:    General: Normal vulva.     Comments: No vaginal discharge present Skin:    General: Skin is warm.  Neurological:     General: No focal deficit present.     Mental Status: She is alert and oriented to person, place, and time.     Cranial Nerves: No cranial nerve deficit.  Psychiatric:        Mood and Affect: Mood normal.        Behavior: Behavior normal.        Thought Content: Thought content normal.        Judgment: Judgment normal.         Assessment And Plan:     1. Prediabetes  Will start her on rybelsus - sample given  Denies family history of thyroid cancer or personal history of pancreatic cancer  Urinalysis shows glucose in her urine - Hemoglobin A1c - Semaglutide (RYBELSUS) 7 MG TABS; Take 1 tablet by mouth daily. Take 30 minutes before breakfast  Dispense: 30 tablet; Refill: 2  2. Acute vaginitis  This is the 3rd time she has had these symptoms in the last 6-7 months  If her results indicate another bacterial vaginosis will consider boric acid - POCT Urinalysis Dipstick (81002) - fluconazole (DIFLUCAN) 100 MG tablet; Take 1 tablet (100 mg total) by mouth daily. Take 1 tablet by mouth now repeat in 5 days  Dispense: 2 tablet; Refill: 0 - BV+Ct/Ng/Tv NAA+Yeast Culture  3. Encounter for hepatitis C screening test for low risk patient  Will check Hepatitis C screening due to recent recommendations to screen all adults 18 years and older - Hepatitis C antibody  4. BMI 36.0-36.9,adult  She is encouraged to strive for BMI less than 30 to decrease cardiac risk. Advised to aim for at least 150 minutes of exercise per  week.  Eat healthy diet low in sugary and starches     Patient was given opportunity to ask questions. Patient verbalized understanding of the plan and was able to repeat key elements of the plan. All questions were answered to their satisfaction.   Teola Bradley, FNP, have reviewed all documentation for this visit. The documentation on 05/22/20 for the exam, diagnosis, procedures, and orders are all accurate and complete.  THE PATIENT IS ENCOURAGED TO PRACTICE SOCIAL DISTANCING DUE TO THE COVID-19 PANDEMIC.

## 2020-05-17 NOTE — Patient Instructions (Signed)
Semaglutide oral tablets What is this medicine? SEMAGLUTIDE (Sem a GLOO tide) is used to improve blood sugar control in adults with type 2 diabetes. This medicine may be used with other diabetes medicines. This medicine may be used for other purposes; ask your health care provider or pharmacist if you have questions. COMMON BRAND NAME(S): Rybelsus What should I tell my health care provider before I take this medicine? They need to know if you have any of these conditions:  endocrine tumors (MEN 2) or if someone in your family had these tumors  eye disease, vision problems  history of pancreatitis  kidney disease  stomach problems  thyroid cancer or if someone in your family had thyroid cancer  an unusual or allergic reaction to semaglutide, other medicines, foods, dyes, or preservatives  pregnant or trying to get pregnant  breast-feeding How should I use this medicine? Take this medicine by mouth with a glass of plain water that is less than 4 ounces (less than 120 mL). Follow the directions on the prescription label. Do not cut, crush or chew this medicine. Swallow the tablets whole. Take at least 30 minutes before the first food, other beverage, or other oral medications of the day. Take your medicine at regular intervals. Do not take your medicine more often than directed. Do not stop taking except on your doctor's advice. A special MedGuide will be given to you by the pharmacist with each prescription and refill. Be sure to read this information carefully each time. Talk to your pediatrician regarding the use of this medicine in children. Special care may be needed. Overdosage: If you think you have taken too much of this medicine contact a poison control center or emergency room at once. NOTE: This medicine is only for you. Do not share this medicine with others. What if I miss a dose? If you miss a dose, skip it. Take your next dose at the normal time. Do not take extra or 2  doses at the same time to make up for the missed dose. What may interact with this medicine? What may interact with this medicine?  aminophylline  carbamazepine  cyclosporine  digoxin  levothyroxine  other medicines for diabetes  phenytoin  tacrolimus  theophylline  warfarin Many medications may cause changes in blood sugar, these include:  alcohol containing beverages  antiviral medicines for HIV or AIDS  aspirin and aspirin-like drugs  certain medicines for blood pressure, heart disease, irregular heart beat  chromium  diuretics  female hormones, such as estrogens or progestins, birth control pills  fenofibrate  gemfibrozil  isoniazid  lanreotide  female hormones or anabolic steroids  MAOIs like Carbex, Eldepryl, Marplan, Nardil, and Parnate  medicines for weight loss  medicines for allergies, asthma, cold, or cough  medicines for depression, anxiety, or psychotic disturbances  niacin  nicotine  NSAIDs, medicines for pain and inflammation, like ibuprofen or naproxen  octreotide  pasireotide  pentamidine  phenytoin  probenecid  quinolone antibiotics such as ciprofloxacin, levofloxacin, ofloxacin  some herbal dietary supplements  steroid medicines such as prednisone or cortisone  sulfamethoxazole; trimethoprim  thyroid hormones Some medications can hide the warning symptoms of low blood sugar (hypoglycemia). You may need to monitor your blood sugar more closely if you are taking one of these medications. These include:  beta-blockers, often used for high blood pressure or heart problems (examples include atenolol, metoprolol, propranolol)  clonidine  guanethidine  reserpine This list may not describe all possible interactions. Give your health care  provider a list of all the medicines, herbs, non-prescription drugs, or dietary supplements you use. Also tell them if you smoke, drink alcohol, or use illegal drugs. Some items may  interact with your medicine. What should I watch for while using this medicine? Visit your doctor or health care professional for regular checks on your progress. Drink plenty of fluids while taking this medicine. Check with your doctor or health care professional if you get an attack of severe diarrhea, nausea, and vomiting. The loss of too much body fluid can make it dangerous for you to take this medicine. A test called the HbA1C (A1C) will be monitored. This is a simple blood test. It measures your blood sugar control over the last 2 to 3 months. You will receive this test every 3 to 6 months. Learn how to check your blood sugar. Learn the symptoms of low and high blood sugar and how to manage them. Always carry a quick-source of sugar with you in case you have symptoms of low blood sugar. Examples include hard sugar candy or glucose tablets. Make sure others know that you can choke if you eat or drink when you develop serious symptoms of low blood sugar, such as seizures or unconsciousness. They must get medical help at once. Tell your doctor or health care professional if you have high blood sugar. You might need to change the dose of your medicine. If you are sick or exercising more than usual, you might need to change the dose of your medicine. Do not skip meals. Ask your doctor or health care professional if you should avoid alcohol. Many nonprescription cough and cold products contain sugar or alcohol. These can affect blood sugar. Wear a medical ID bracelet or chain, and carry a card that describes your disease and details of your medicine and dosage times. Do not become pregnant while taking this medicine. Women should inform their doctor if they wish to become pregnant or think they might be pregnant. There is a potential for serious side effects to an unborn child. Talk to your health care professional or pharmacist for more information. Do not breast-feed an infant while taking this  medicine. What side effects may I notice from receiving this medicine? Side effects that you should report to your doctor or health care professional as soon as possible:  allergic reactions like skin rash, itching or hives, swelling of the face, lips, or tongue  breathing problems  changes in vision  diarrhea that continues or is severe  lump or swelling on the neck  severe nausea  signs and symptoms of infection like fever or chills; cough; sore throat; pain or trouble passing urine  signs and symptoms of low blood sugar such as feeling anxious, confusion, dizziness, increased hunger, unusually weak or tired, sweating, shakiness, cold, irritable, headache, blurred vision, fast heartbeat, loss of consciousness  signs and symptoms of kidney injury like trouble passing urine or change in the amount of urine  trouble swallowing  unusual stomach upset or pain  vomiting Side effects that usually do not require medical attention (report these to your doctor or health care professional if they continue or are bothersome):  constipation  diarrhea  nausea  stomach upset This list may not describe all possible side effects. Call your doctor for medical advice about side effects. You may report side effects to FDA at 1-800-FDA-1088. Where should I keep my medicine? Keep out of the reach of children. Store at room temperature between 15 and 30  degrees C (59 and 86 degrees F). Keep this medicine in the original blister card until use. Keep in a dry place. Throw away any unused medicine after the expiration date. NOTE: This sheet is a summary. It may not cover all possible information. If you have questions about this medicine, talk to your doctor, pharmacist, or health care provider.  2020 Elsevier/Gold Standard (2018-06-03 10:07:43)

## 2020-05-18 LAB — HEMOGLOBIN A1C
Est. average glucose Bld gHb Est-mCnc: 94 mg/dL
Hgb A1c MFr Bld: 4.9 % (ref 4.8–5.6)

## 2020-05-18 LAB — HEPATITIS C ANTIBODY: Hep C Virus Ab: <0.1 s/co ratio (ref 0.0–0.9)

## 2020-05-19 LAB — BV+CT/NG/TV NAA+YEAST CULTURE
Chlamydia by NAA: NEGATIVE
Gonococcus by NAA: NEGATIVE
Trich vag by NAA: NEGATIVE

## 2020-06-14 ENCOUNTER — Other Ambulatory Visit: Payer: Self-pay

## 2020-06-14 ENCOUNTER — Encounter: Payer: Self-pay | Admitting: Nurse Practitioner

## 2020-06-14 ENCOUNTER — Ambulatory Visit (INDEPENDENT_AMBULATORY_CARE_PROVIDER_SITE_OTHER): Payer: 59 | Admitting: Nurse Practitioner

## 2020-06-14 VITALS — BP 122/84 | HR 82 | Temp 98.5°F | Ht 64.6 in | Wt 204.0 lb

## 2020-06-14 DIAGNOSIS — Z6834 Body mass index (BMI) 34.0-34.9, adult: Secondary | ICD-10-CM

## 2020-06-14 DIAGNOSIS — R7303 Prediabetes: Secondary | ICD-10-CM

## 2020-06-14 MED ORDER — BLOOD GLUCOSE MONITOR KIT: PACK | 0 refills | Status: DC

## 2020-06-14 NOTE — Progress Notes (Signed)
I,Yamilka Roman [employer] as a Education administrator for [employer], FNP.,have documented all relevant documentation on the behalf of [name], FNP,as directed by  [name], FNP while in the presence of [name], [place].  This visit occurred during the SARS-CoV-2 public health emergency.  Safety protocols were in place, including screening questions prior to the visit, additional usage of staff PPE, and extensive cleaning of exam room while observing appropriate contact time as indicated for disinfecting solutions.  Subjective:     Patient ID: Katherine Mitchell , female    DOB: [birth year] , 44 y.o.   MRN: [id number]   Chief Complaint  Patient presents with  . Prediabetes    HPI  Pt presents today for diabetes check   Wt Readings from Last 3 Encounters: 06/14/20 : 204 lb (92.5 kg) 05/17/20 : 209 lb (94.8 kg) 01/26/20 : 205 lb (93 kg)  She is taking Rybelsus had nausea initially but is better now. She does notice her appetite is better.   Diabetes She presents for her follow-up diabetic visit. Diabetes type: prediabetes. There are no hypoglycemic associated symptoms. Pertinent negatives for hypoglycemia include no dizziness. There are no diabetic associated symptoms. Pertinent negatives for diabetes include no chest pain, no polydipsia, no polyphagia and no polyuria. There are no hypoglycemic complications. Symptoms are stable. There are no diabetic complications. Risk factors for coronary artery disease include obesity and sedentary lifestyle. Current diabetic treatment includes oral agent (monotherapy). She is compliant with treatment all of the time. Her weight is decreasing steadily. She is following a generally unhealthy diet. When asked about meal planning, she reported none. She rarely participates in exercise. There is no change in her home blood glucose trend. (She does not check regularly) ACE inhibitor/angiotensin II receptor blocker: she is prediabetic. She does not see a  podiatrist.Eye exam is current.     Past Medical History:  Diagnosis Date  . Anemia    ULCERATIVE COLITIS  . Blood transfusion without reported diagnosis 2013   HAS HAD 2  . Gestational diabetes    glyburide; ALL PREGNANCIES  . Herpes   . History of maternal blood transfusion, currently pregnant   . Infection 2000   CHLAMYDIA  . Infection 2000   [place]  . Leiomyoma of uterus, unspecified 2009  . Maternal anemia, with delivery 11/14/2011  . Other venous complication of pregnancy and the puerperium, unspecified as to episode of care(671.80)    hemorrhoids  . PP care - s/p VBAC 3/4 11/12/2011  . Pregnancy with other poor obstetric history(V23.49)   . Previous cesarean delivery, unspecified as to episode of care or not applicable(654.20)   . Proteinuria   . Ulcerative colitis, unspecified   . VBAC, delivered, current hospitalization 11/26/2012     Family History  Problem Relation Age of Onset  . Diabetes Mother   . Cancer Mother        MULTIPLE MYELOMA     Current Outpatient Medications:  .  Semaglutide (RYBELSUS) 7 MG TABS, Take 1 tablet by mouth daily. Take 30 minutes before breakfast, Disp: 30 tablet, Rfl: 2 .  blood glucose meter kit and supplies KIT, Dispense based on patient and insurance preference. Use up to four times daily as directed. (FOR ICD-9 250.00, 250.01)., Disp: 1 each, Rfl: 0   No Known Allergies   Review of Systems  Constitutional: Negative.   Respiratory: Negative.  Negative for cough.   Cardiovascular: Negative for chest pain, palpitations and leg swelling.  Endocrine: Negative for polydipsia, polyphagia  and polyuria.  Neurological: Negative for dizziness.  Psychiatric/Behavioral: Negative.      Today's Vitals   06/14/20 0947  BP: 122/84  Pulse: 82  Temp: 98.5 F (36.9 C)  TempSrc: Oral  Weight: 204 lb (92.5 kg)  Height: 5' 4.6" (1.641 m)  PainSc: 0-No pain   Body mass index is 34.37 kg/m.   Objective:  Physical Exam Vitals reviewed.   Constitutional:      General: She is not in acute distress.    Appearance: Normal appearance. She is obese.  Pulmonary:     Effort: Pulmonary effort is normal. No respiratory distress.  Neurological:     General: No focal deficit present.     Mental Status: She is alert and oriented to person, place, and time.     Cranial Nerves: No cranial nerve deficit.  Psychiatric:        Mood and Affect: Mood normal.        Behavior: Behavior normal.        Thought Content: Thought content normal.        Judgment: Judgment normal.         Assessment And Plan:     1. Prediabetes  She is tolerating medications well, she is to continue at the current dose  Will order a glucometer for her to check her blood sugar daily - blood glucose meter kit and supplies KIT; Dispense based on patient and insurance preference. Use up to four times daily as directed. (FOR ICD-9 250.00, 250.01).  Dispense: 1 each; Refill: 0  2. BMI 34.0-34.9,adult  She has lost 5 lbs since her last visit  Encouraged to increase physical activity to at least 150 minutes per week  She is encouraged to strive for BMI less than 30 to decrease cardiac risk.     Patient was given opportunity to ask questions. Patient verbalized understanding of the plan and was able to repeat key elements of the plan. All questions were answered to their satisfaction.   Teola Bradley, FNP, have reviewed all documentation for this visit. The documentation on 06/14/20 for the exam, diagnosis, procedures, and orders are all accurate and complete.  THE PATIENT IS ENCOURAGED TO PRACTICE SOCIAL DISTANCING DUE TO THE COVID-19 PANDEMIC.

## 2020-06-14 NOTE — Patient Instructions (Signed)
Prediabetes Prediabetes is the condition of having a blood sugar (blood glucose) level that is higher than it should be, but not high enough for you to be diagnosed with type 2 diabetes. Having prediabetes puts you at risk for developing type 2 diabetes (type 2 diabetes mellitus). Prediabetes may be called impaired glucose tolerance or impaired fasting glucose. Prediabetes usually does not cause symptoms. Your health care provider can diagnose this condition with blood tests. You may be tested for prediabetes if you are overweight and if you have at least one other risk factor for prediabetes. What is blood glucose, and how is it measured? Blood glucose refers to the amount of glucose in your bloodstream. Glucose comes from eating foods that contain sugars and starches (carbohydrates), which the body breaks down into glucose. Your blood glucose level may be measured in mg/dL (milligrams per deciliter) or mmol/L (millimoles per liter). Your blood glucose may be checked with one or more of the following blood tests:  A fasting blood glucose (FBG) test. You will not be allowed to eat (you will fast) for 8 hours or longer before a blood sample is taken. ? A normal range for FBG is 70-100 mg/dl (3.9-5.6 mmol/L).  An A1c (hemoglobin A1c) blood test. This test provides information about blood glucose control over the previous 2?56month.  An oral glucose tolerance test (OGTT). This test measures your blood glucose at two times: ? After fasting. This is your baseline level. ? Two hours after you drink a beverage that contains glucose. You may be diagnosed with prediabetes:  If your FBG is 100?125 mg/dL (5.6-6.9 mmol/L).  If your A1c level is 5.7?6.4%.  If your OGTT result is 140?199 mg/dL (7.8-11 mmol/L). These blood tests may be repeated to confirm your diagnosis. How can this condition affect me? The pancreas produces a hormone (insulin) that helps to move glucose from the bloodstream into cells.  When cells in the body do not respond properly to insulin that the body makes (insulin resistance), excess glucose builds up in the blood instead of going into cells. As a result, high blood glucose (hyperglycemia) can develop, which can cause many complications. Hyperglycemia is a symptom of prediabetes. Having high blood glucose for a long time is dangerous. Too much glucose in your blood can damage your nerves and blood vessels. Long-term damage can lead to complications from diabetes, which may include:  Heart disease.  Stroke.  Blindness.  Kidney disease.  Depression.  Poor circulation in the feet and legs, which could lead to surgical removal (amputation) in severe cases. What can increase my risk? Risk factors for prediabetes include:  Having a family member with type 2 diabetes.  Being overweight or obese.  Being older than age 43  Being of American [place] African-American, Hispanic/Latino, or Asian/Pacific Islander descent.  Having an inactive (sedentary) lifestyle.  Having a history of heart disease.  History of gestational diabetes or polycystic ovary syndrome (PCOS), in women.  Having low levels of good cholesterol (HDL-C) or high levels of blood fats (triglycerides).  Having high blood pressure. What actions can I take to prevent diabetes?      Be physically active. ? Do moderate-intensity physical activity for 30 or more minutes on 5 or more days of the week, or as much as told by your health care provider. This could be brisk walking, biking, or water aerobics. ? Ask your health care provider what activities are safe for you. A mix of physical activities may be best, such as  walking, swimming, cycling, and strength training.  Lose weight as told by your health care provider. ? Losing 5-7% of your body weight can reverse insulin resistance. ? Your health care provider can determine how much weight loss is best for you and can help you lose weight  safely.  Follow a healthy meal plan. This includes eating lean proteins, complex carbohydrates, fresh fruits and vegetables, low-fat dairy products, and healthy fats. ? Follow instructions from your health care provider about eating or drinking restrictions. ? Make an appointment to see a diet and nutrition specialist (registered dietitian) to help you create a healthy eating plan that is right for you.  Do not smoke or use any tobacco products, such as cigarettes, chewing tobacco, and e-cigarettes. If you need help quitting, ask your health care provider.  Take over-the-counter and prescription medicines as told by your health care provider. You may be prescribed medicines that help lower the risk of type 2 diabetes.  Keep all follow-up visits as told by your health care provider. This is important. Summary  Prediabetes is the condition of having a blood sugar (blood glucose) level that is higher than it should be, but not high enough for you to be diagnosed with type 2 diabetes.  Having prediabetes puts you at risk for developing type 2 diabetes (type 2 diabetes mellitus).  To help prevent type 2 diabetes, make lifestyle changes such as being physically active and eating a healthy diet. Lose weight as told by your health care provider. This information is not intended to replace advice given to you by your health care provider. Make sure you discuss any questions you have with your health care provider. Document Revised: 12/19/2018 Document Reviewed: 10/18/2015 Elsevier Patient Education  [place].

## 2020-06-21 ENCOUNTER — Other Ambulatory Visit: Payer: Self-pay

## 2020-06-21 DIAGNOSIS — R7303 Prediabetes: Secondary | ICD-10-CM

## 2020-06-21 MED ORDER — BLOOD GLUCOSE MONITOR KIT: PACK | 0 refills | Status: DC

## 2020-07-15 ENCOUNTER — Other Ambulatory Visit: Payer: Self-pay

## 2020-07-15 DIAGNOSIS — R7303 Prediabetes: Secondary | ICD-10-CM

## 2020-07-15 MED ORDER — BLOOD GLUCOSE MONITOR KIT: PACK | 0 refills | Status: DC

## 2020-07-20 ENCOUNTER — Other Ambulatory Visit: Payer: Self-pay

## 2020-07-20 DIAGNOSIS — R7303 Prediabetes: Secondary | ICD-10-CM

## 2020-07-20 MED ORDER — BLOOD GLUCOSE MONITOR KIT: PACK | 0 refills | Status: DC

## 2020-09-14 ENCOUNTER — Ambulatory Visit: Payer: 59 | Admitting: Nurse Practitioner

## 2020-10-07 ENCOUNTER — Encounter: Payer: Self-pay | Admitting: Nurse Practitioner

## 2021-11-14 ENCOUNTER — Other Ambulatory Visit: Payer: Self-pay

## 2021-11-14 ENCOUNTER — Ambulatory Visit (INDEPENDENT_AMBULATORY_CARE_PROVIDER_SITE_OTHER): Payer: 59 | Admitting: Nurse Practitioner

## 2021-11-14 ENCOUNTER — Encounter: Payer: Self-pay | Admitting: Nurse Practitioner

## 2021-11-14 VITALS — BP 116/74 | HR 84 | Temp 99.3°F | Ht 64.6 in | Wt 215.2 lb

## 2021-11-14 DIAGNOSIS — E6609 Other obesity due to excess calories: Secondary | ICD-10-CM

## 2021-11-14 DIAGNOSIS — R21 Rash and other nonspecific skin eruption: Secondary | ICD-10-CM

## 2021-11-14 DIAGNOSIS — E1169 Type 2 diabetes mellitus with other specified complication: Secondary | ICD-10-CM

## 2021-11-14 DIAGNOSIS — R7303 Prediabetes: Secondary | ICD-10-CM

## 2021-11-14 DIAGNOSIS — Z6836 Body mass index (BMI) 36.0-36.9, adult: Secondary | ICD-10-CM | POA: Diagnosis not present

## 2021-11-14 DIAGNOSIS — E119 Type 2 diabetes mellitus without complications: Secondary | ICD-10-CM

## 2021-11-14 DIAGNOSIS — Z23 Encounter for immunization: Secondary | ICD-10-CM

## 2021-11-14 DIAGNOSIS — Z1231 Encounter for screening mammogram for malignant neoplasm of breast: Secondary | ICD-10-CM

## 2021-11-14 LAB — BMP8+EGFR
BUN/Creatinine Ratio: 15 (ref 9–23)
BUN: 10 mg/dL (ref 6–24)
CO2: 20 mmol/L (ref 20–29)
Calcium: 9.8 mg/dL (ref 8.7–10.2)
Chloride: 106 mmol/L (ref 96–106)
Creatinine, Ser: 0.65 mg/dL (ref 0.57–1.00)
Glucose: 120 mg/dL — ABNORMAL HIGH (ref 70–99)
Potassium: 4.6 mmol/L (ref 3.5–5.2)
Sodium: 140 mmol/L (ref 134–144)
eGFR: 111 mL/min/{1.73_m2} (ref >59)

## 2021-11-14 LAB — HEMOGLOBIN A1C
Est. average glucose Bld gHb Est-mCnc: 157 mg/dL
Hgb A1c MFr Bld: 7.1 % — ABNORMAL HIGH (ref 4.8–5.6)

## 2021-11-14 MED ORDER — TETANUS-DIPHTH-ACELL PERTUSSIS 5-2.5-18.5 LF-MCG/0.5 IM SUSY
0.5000 mL | PREFILLED_SYRINGE | Freq: Once | INTRAMUSCULAR | Status: AC
  Administered 2021-11-14: 0.5 mL via INTRAMUSCULAR

## 2021-11-14 MED ORDER — BLOOD GLUCOSE MONITOR KIT: PACK | 0 refills | Status: DC

## 2021-11-14 MED ORDER — NYSTATIN 100000 UNIT/GM EX POWD: 1.0000 "application " | Freq: Three times a day (TID) | CUTANEOUS | 0 refills | Status: DC

## 2021-11-14 NOTE — Progress Notes (Unsigned)
I,Rodel Glaspy,acting as a Education administrator for [employer], FNP.,have documented all relevant documentation on the behalf of Katherine Brine, FNP,as directed by  Katherine Brine, FNP while in the presence of Katherine Mitchell, [place].  This visit occurred during the SARS-CoV-2 public health emergency.  Safety protocols were in place, including screening questions prior to the visit, additional usage of staff PPE, and extensive cleaning of exam room while observing appropriate contact time as indicated for disinfecting solutions.  Subjective:     Patient ID: Katherine Mitchell , female    DOB: May 30, 1976 , 46 y.o.   MRN: [id number]   Chief Complaint  Patient presents with   Rash   Diabetes    HPI  The patient is here today for evaluation of a rash at her bikini line. She has used hydrocortisone cream which helped initially. She had been scratching.   She had been on Rybelsus previously and stopped taking the medication due to nausea.      Rash This is a new problem. The current episode started more than 1 month ago. The affected locations include the groin. Past treatments include topical steroids. The treatment provided mild relief. There is no history of asthma.    Past Medical History:  Diagnosis Date   Anemia    ULCERATIVE COLITIS   Blood transfusion without reported diagnosis 2013   HAS HAD 2   Gestational diabetes    glyburide; ALL PREGNANCIES   Herpes    History of maternal blood transfusion, currently pregnant    Infection 2000   CHLAMYDIA   Infection 2000   [place]   Leiomyoma of uterus, unspecified 2009   Maternal anemia, with delivery 11/14/2011   Other venous complication of pregnancy and the puerperium, unspecified as to episode of care(671.80)    hemorrhoids   PP care - s/p VBAC 3/4 11/12/2011   Pregnancy with other poor obstetric history(V23.49)    Previous cesarean delivery, unspecified as to episode of care or not applicable(654.20)    Proteinuria    Ulcerative colitis,  unspecified    VBAC, delivered, current hospitalization 11/26/2012     Family History  Problem Relation Age of Onset   Diabetes Mother    Cancer Mother        MULTIPLE MYELOMA     Current Outpatient Medications:    nystatin powder, Apply 1 application. topically 3 (three) times daily., Disp: 15 g, Rfl: 0   blood glucose meter kit and supplies KIT, Dispense based on patient and insurance preference. Use up to four times daily as directed. (FOR ICD-9 250.00, 250.01)., Disp: 1 each, Rfl: 0   Semaglutide (RYBELSUS) 7 MG TABS, Take 1 tablet by mouth daily. Take 30 minutes before breakfast, Disp: 30 tablet, Rfl: 2  Current Facility-Administered Medications:    Tdap (BOOSTRIX) injection 0.5 mL, 0.5 mL, Intramuscular, Once, Katherine Brine, FNP   No Known Allergies   Review of Systems  Constitutional: Negative.   Respiratory: Negative.    Cardiovascular: Negative.   Skin:  Positive for rash (1.5 months with irrititation).  Neurological:  Negative for dizziness and headaches.  Psychiatric/Behavioral: Negative.      Today's Vitals   11/14/21 1010  BP: 116/74  Pulse: 84  Temp: 99.3 F (37.4 C)  Weight: 215 lb 3.2 oz (97.6 kg)  Height: 5' 4.6" (1.641 m)   Body mass index is 36.26 kg/m.  Wt Readings from Last 3 Encounters:  11/14/21 215 lb 3.2 oz (97.6 kg)  06/14/20 204 lb (92.5 kg)  05/17/20  209 lb (94.8 kg)    BP Readings from Last 3 Encounters:  11/14/21 116/74  06/14/20 122/84  05/17/20 118/76    Objective:  Physical Exam Vitals reviewed.  Constitutional:      General: She is not in acute distress.    Appearance: Normal appearance. She is obese.  Cardiovascular:     Pulses: Normal pulses.     Heart sounds: Normal heart sounds. No murmur heard. Pulmonary:     Effort: Pulmonary effort is normal. No respiratory distress.     Breath sounds: No wheezing.  Neurological:     General: No focal deficit present.     Mental Status: She is alert and oriented to person, place,  and time.     Cranial Nerves: No cranial nerve deficit.     Motor: No weakness.  Psychiatric:        Mood and Affect: Mood normal.        Behavior: Behavior normal.        Thought Content: Thought content normal.        Judgment: Judgment normal.        Assessment And Plan:     1. Rash Comments: groin area with hyperpigmented skin, will treat for yeast  - nystatin powder; Apply 1 application. topically 3 (three) times daily.  Dispense: 15 g; Refill: 0  2. Class 2 obesity due to excess calories without serious comorbidity with body mass index (BMI) of 36.0 to 36.9 in adult  3. Encounter for screening mammogram for breast cancer - MM Digital Screening; Future  4. Diabetes mellitus type II, non insulin dependent (HCC) - Hemoglobin A1c - BMP8+EGFR  5. Encounter for immunization - Tdap (BOOSTRIX) injection 0.5 mL She is encouraged to strive for BMI less than 30 to decrease cardiac risk. Advised to aim for at least 150 minutes of exercise per week.     Patient was given opportunity to ask questions. Patient verbalized understanding of the plan and was able to repeat key elements of the plan. All questions were answered to their satisfaction.  Katherine Brine, FNP   I, Katherine Brine, FNP, have reviewed all documentation for this visit. The documentation on 11/14/21 for the exam, diagnosis, procedures, and orders are all accurate and complete.   IF YOU HAVE BEEN REFERRED TO A SPECIALIST, IT MAY TAKE 1-2 WEEKS TO SCHEDULE/PROCESS THE REFERRAL. IF YOU HAVE NOT HEARD FROM US/SPECIALIST IN TWO WEEKS, PLEASE GIVE [place] A CALL AT [phone].   THE PATIENT IS ENCOURAGED TO PRACTICE SOCIAL DISTANCING DUE TO THE COVID-19 PANDEMIC.

## 2021-11-17 ENCOUNTER — Other Ambulatory Visit: Payer: Self-pay

## 2021-11-17 MED ORDER — ACCU-CHEK GUIDE VI STRP: ORAL_STRIP | 12 refills | Status: DC

## 2021-11-21 ENCOUNTER — Ambulatory Visit: Payer: Self-pay | Admitting: Nurse Practitioner

## 2021-12-04 ENCOUNTER — Encounter: Payer: Self-pay | Admitting: Nurse Practitioner

## 2021-12-04 DIAGNOSIS — Z5320 Procedure and treatment not carried out because of patient's decision for unspecified reasons: Secondary | ICD-10-CM | POA: Insufficient documentation

## 2022-02-14 ENCOUNTER — Ambulatory Visit: Payer: 59 | Admitting: Nurse Practitioner

## 2022-03-21 ENCOUNTER — Other Ambulatory Visit (HOSPITAL_COMMUNITY)
Admission: RE | Admit: 2022-03-21 | Discharge: 2022-03-21 | Disposition: A | Discharge status: Home / Self Care | Payer: 59 | Source: Ambulatory Visit | Attending: Nurse Practitioner | Admitting: Nurse Practitioner

## 2022-03-21 ENCOUNTER — Ambulatory Visit (INDEPENDENT_AMBULATORY_CARE_PROVIDER_SITE_OTHER): Payer: 59 | Admitting: Nurse Practitioner

## 2022-03-21 ENCOUNTER — Encounter: Payer: Self-pay | Admitting: Nurse Practitioner

## 2022-03-21 VITALS — BP 120/60 | HR 67 | Temp 98.2°F | Ht 64.0 in | Wt 212.6 lb

## 2022-03-21 DIAGNOSIS — Z0000 Encounter for general adult medical examination without abnormal findings: Secondary | ICD-10-CM | POA: Diagnosis present

## 2022-03-21 DIAGNOSIS — R221 Localized swelling, mass and lump, neck: Secondary | ICD-10-CM

## 2022-03-21 DIAGNOSIS — E6609 Other obesity due to excess calories: Secondary | ICD-10-CM | POA: Diagnosis not present

## 2022-03-21 DIAGNOSIS — Z124 Encounter for screening for malignant neoplasm of cervix: Secondary | ICD-10-CM

## 2022-03-21 DIAGNOSIS — Z6836 Body mass index (BMI) 36.0-36.9, adult: Secondary | ICD-10-CM | POA: Diagnosis not present

## 2022-03-21 DIAGNOSIS — E1169 Type 2 diabetes mellitus with other specified complication: Secondary | ICD-10-CM | POA: Diagnosis not present
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
## 2022-03-21 NOTE — Addendum Note (Signed)
Addended by: [name] F on: 03/21/2022 10:56 PM   Modules accepted: Orders

## 2022-03-21 NOTE — Patient Instructions (Addendum)

## 2022-03-22 LAB — CMP14+EGFR
ALT: 8 IU/L (ref 0–32)
AST: 12 IU/L (ref 0–40)
Albumin/Globulin Ratio: 1.7 (ref 1.2–2.2)
Albumin: 4.5 g/dL (ref 3.9–4.9)
Alkaline Phosphatase: 70 IU/L (ref 44–121)
BUN/Creatinine Ratio: 17 (ref 9–23)
BUN: 12 mg/dL (ref 6–24)
Bilirubin Total: 0.6 mg/dL (ref 0.0–1.2)
CO2: 20 mmol/L (ref 20–29)
Calcium: 9.6 mg/dL (ref 8.7–10.2)
Chloride: 104 mmol/L (ref 96–106)
Creatinine, Ser: 0.69 mg/dL (ref 0.57–1.00)
Globulin, Total: 2.7 g/dL (ref 1.5–4.5)
Glucose: 95 mg/dL (ref 70–99)
Potassium: 4.6 mmol/L (ref 3.5–5.2)
Sodium: 138 mmol/L (ref 134–144)
Total Protein: 7.2 g/dL (ref 6.0–8.5)
eGFR: 109 mL/min/{1.73_m2} (ref >59)

## 2022-03-22 LAB — LIPID PANEL
Chol/HDL Ratio: 4.6 ratio — ABNORMAL HIGH (ref 0.0–4.4)
Cholesterol, Total: 176 mg/dL (ref 100–199)
HDL: 38 mg/dL — ABNORMAL LOW (ref >39)
LDL Chol Calc (NIH): 127 mg/dL — ABNORMAL HIGH (ref 0–99)
Triglycerides: 54 mg/dL (ref 0–149)
VLDL Cholesterol Cal: 11 mg/dL (ref 5–40)

## 2022-03-22 LAB — HEMOGLOBIN A1C
Est. average glucose Bld gHb Est-mCnc: 154 mg/dL
Hgb A1c MFr Bld: 7 % — ABNORMAL HIGH (ref 4.8–5.6)

## 2022-03-22 LAB — CBC
Hematocrit: 36.7 % (ref 34.0–46.6)
Hemoglobin: 11.8 g/dL (ref 11.1–15.9)
MCH: 27.2 pg (ref 26.6–33.0)
MCHC: 32.2 g/dL (ref 31.5–35.7)
MCV: 85 fL (ref 79–97)
Platelets: 369 10*3/uL (ref 150–450)
RBC: 4.34 x10E6/uL (ref 3.77–5.28)
RDW: 13.2 % (ref 11.7–15.4)
WBC: 5.8 10*3/uL (ref 3.4–10.8)

## 2022-03-22 LAB — MICROALBUMIN / CREATININE URINE RATIO
Creatinine, Urine: 195.2 mg/dL
Microalb/Creat Ratio: 4 mg/g creat (ref 0–29)
Microalbumin, Urine: 7 ug/mL

## 2022-03-23 LAB — SPECIMEN STATUS REPORT

## 2022-03-23 LAB — TSH: TSH: 0.751 u[IU]/mL (ref 0.450–4.500)

## 2022-03-26 ENCOUNTER — Other Ambulatory Visit: Payer: Self-pay | Admitting: Nurse Practitioner

## 2022-03-26 DIAGNOSIS — E7800 Pure hypercholesterolemia, unspecified: Secondary | ICD-10-CM

## 2022-03-26 DIAGNOSIS — E1169 Type 2 diabetes mellitus with other specified complication: Secondary | ICD-10-CM

## 2022-03-26 MED ORDER — ATORVASTATIN CALCIUM 10 MG PO TABS: 10.0000 mg | ORAL_TABLET | Freq: Every day | ORAL | 2 refills | Status: DC

## 2022-03-28 LAB — CYTOLOGY - PAP
Comment: NEGATIVE
Diagnosis: NEGATIVE
High risk HPV: NEGATIVE

## 2022-03-30 ENCOUNTER — Ambulatory Visit (HOSPITAL_BASED_OUTPATIENT_CLINIC_OR_DEPARTMENT_OTHER)
Admission: RE | Admit: 2022-03-30 | Discharge: 2022-03-30 | Disposition: A | Discharge status: Home / Self Care | Payer: 59 | Source: Ambulatory Visit | Attending: Nurse Practitioner | Admitting: Nurse Practitioner

## 2022-03-30 ENCOUNTER — Other Ambulatory Visit: Payer: Self-pay | Admitting: Nurse Practitioner

## 2022-03-30 DIAGNOSIS — Z1231 Encounter for screening mammogram for malignant neoplasm of breast: Secondary | ICD-10-CM

## 2022-04-03 ENCOUNTER — Other Ambulatory Visit: Payer: Self-pay | Admitting: Nurse Practitioner

## 2022-04-03 DIAGNOSIS — E1169 Type 2 diabetes mellitus with other specified complication: Secondary | ICD-10-CM

## 2022-04-03 MED ORDER — RYBELSUS 3 MG PO TABS: 1.0000 | ORAL_TABLET | Freq: Every day | ORAL | 1 refills | Status: DC

## 2022-05-11 ENCOUNTER — Ambulatory Visit
Admission: RE | Admit: 2022-05-11 | Discharge: 2022-05-11 | Disposition: A | Discharge status: Home / Self Care | Payer: 59 | Source: Ambulatory Visit | Attending: Nurse Practitioner | Admitting: Nurse Practitioner

## 2022-05-11 DIAGNOSIS — R221 Localized swelling, mass and lump, neck: Secondary | ICD-10-CM | POA: Diagnosis not present

## 2022-06-04 DIAGNOSIS — Z1211 Encounter for screening for malignant neoplasm of colon: Secondary | ICD-10-CM | POA: Diagnosis not present

## 2022-06-04 DIAGNOSIS — K625 Hemorrhage of anus and rectum: Secondary | ICD-10-CM | POA: Diagnosis not present

## 2022-06-04 DIAGNOSIS — K5130 Ulcerative (chronic) rectosigmoiditis without complications: Secondary | ICD-10-CM | POA: Diagnosis not present

## 2022-06-25 ENCOUNTER — Ambulatory Visit: Payer: 59 | Admitting: Nurse Practitioner

## 2022-07-17 DIAGNOSIS — Z1211 Encounter for screening for malignant neoplasm of colon: Secondary | ICD-10-CM | POA: Diagnosis not present

## 2022-07-17 DIAGNOSIS — K5150 Left sided colitis without complications: Secondary | ICD-10-CM | POA: Diagnosis not present

## 2022-07-17 DIAGNOSIS — K5130 Ulcerative (chronic) rectosigmoiditis without complications: Secondary | ICD-10-CM | POA: Diagnosis not present

## 2022-07-17 LAB — HM COLONOSCOPY

## 2022-07-23 ENCOUNTER — Other Ambulatory Visit: Payer: Self-pay

## 2022-07-23 MED ORDER — ACCU-CHEK GUIDE VI STRP: ORAL_STRIP | 12 refills | Status: DC

## 2022-07-23 MED ORDER — ACCU-CHEK SOFTCLIX LANCETS MISC: 12 refills | Status: DC

## 2022-07-24 ENCOUNTER — Ambulatory Visit: Payer: 59 | Admitting: Nurse Practitioner

## 2022-07-24 ENCOUNTER — Encounter: Payer: Self-pay | Admitting: Nurse Practitioner

## 2022-07-24 VITALS — BP 124/80 | HR 87 | Temp 98.7°F | Ht 64.0 in | Wt 212.0 lb

## 2022-07-24 DIAGNOSIS — Z2821 Immunization not carried out because of patient refusal: Secondary | ICD-10-CM | POA: Diagnosis not present

## 2022-07-24 DIAGNOSIS — E7800 Pure hypercholesterolemia, unspecified: Secondary | ICD-10-CM

## 2022-07-24 DIAGNOSIS — Z79899 Other long term (current) drug therapy: Secondary | ICD-10-CM

## 2022-07-24 DIAGNOSIS — Z6836 Body mass index (BMI) 36.0-36.9, adult: Secondary | ICD-10-CM

## 2022-07-24 DIAGNOSIS — E1169 Type 2 diabetes mellitus with other specified complication: Secondary | ICD-10-CM

## 2022-07-24 DIAGNOSIS — E669 Obesity, unspecified: Secondary | ICD-10-CM | POA: Diagnosis not present

## 2022-07-24 DIAGNOSIS — E6609 Other obesity due to excess calories: Secondary | ICD-10-CM | POA: Diagnosis not present

## 2022-07-24 MED ORDER — TRULICITY 0.75 MG/0.5ML ~~LOC~~ SOAJ: 0.7500 mg | SUBCUTANEOUS | 1 refills | Status: DC

## 2022-07-24 NOTE — Patient Instructions (Signed)
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
## 2022-07-25 LAB — CMP14+EGFR
ALT: 7 IU/L (ref 0–32)
AST: 13 IU/L (ref 0–40)
Albumin/Globulin Ratio: 1.5 (ref 1.2–2.2)
Albumin: 4.4 g/dL (ref 3.9–4.9)
Alkaline Phosphatase: 74 IU/L (ref 44–121)
BUN/Creatinine Ratio: 15 (ref 9–23)
BUN: 10 mg/dL (ref 6–24)
Bilirubin Total: 0.8 mg/dL (ref 0.0–1.2)
CO2: 24 mmol/L (ref 20–29)
Calcium: 9.4 mg/dL (ref 8.7–10.2)
Chloride: 102 mmol/L (ref 96–106)
Creatinine, Ser: 0.67 mg/dL (ref 0.57–1.00)
Globulin, Total: 3 g/dL (ref 1.5–4.5)
Glucose: 112 mg/dL — ABNORMAL HIGH (ref 70–99)
Potassium: 4.6 mmol/L (ref 3.5–5.2)
Sodium: 139 mmol/L (ref 134–144)
Total Protein: 7.4 g/dL (ref 6.0–8.5)
eGFR: 110 mL/min/{1.73_m2} (ref >59)

## 2022-07-25 LAB — CBC
Hematocrit: 36.7 % (ref 34.0–46.6)
Hemoglobin: 11.7 g/dL (ref 11.1–15.9)
MCH: 27.3 pg (ref 26.6–33.0)
MCHC: 31.9 g/dL (ref 31.5–35.7)
MCV: 86 fL (ref 79–97)
Platelets: 358 10*3/uL (ref 150–450)
RBC: 4.28 x10E6/uL (ref 3.77–5.28)
RDW: 13.2 % (ref 11.7–15.4)
WBC: 5.4 10*3/uL (ref 3.4–10.8)

## 2022-07-25 LAB — LIPID PANEL
Chol/HDL Ratio: 4.5 ratio — ABNORMAL HIGH (ref 0.0–4.4)
Cholesterol, Total: 201 mg/dL — ABNORMAL HIGH (ref 100–199)
HDL: 45 mg/dL (ref >39)
LDL Chol Calc (NIH): 144 mg/dL — ABNORMAL HIGH (ref 0–99)
Triglycerides: 64 mg/dL (ref 0–149)
VLDL Cholesterol Cal: 12 mg/dL (ref 5–40)

## 2022-07-25 LAB — HEMOGLOBIN A1C
Est. average glucose Bld gHb Est-mCnc: 163 mg/dL
Hgb A1c MFr Bld: 7.3 % — ABNORMAL HIGH (ref 4.8–5.6)

## 2022-08-01 ENCOUNTER — Other Ambulatory Visit: Payer: Self-pay | Admitting: Nurse Practitioner

## 2022-08-01 DIAGNOSIS — E1169 Type 2 diabetes mellitus with other specified complication: Secondary | ICD-10-CM

## 2022-11-22 ENCOUNTER — Ambulatory Visit: Payer: 59 | Admitting: Nurse Practitioner

## 2022-11-26 ENCOUNTER — Encounter: Payer: Self-pay | Admitting: Podiatry

## 2022-11-26 ENCOUNTER — Ambulatory Visit: Payer: 59 | Admitting: Podiatry

## 2022-11-26 ENCOUNTER — Ambulatory Visit (INDEPENDENT_AMBULATORY_CARE_PROVIDER_SITE_OTHER): Payer: 59

## 2022-11-26 ENCOUNTER — Other Ambulatory Visit: Payer: Self-pay | Admitting: Podiatry

## 2022-11-26 DIAGNOSIS — M722 Plantar fascial fibromatosis: Secondary | ICD-10-CM

## 2022-11-26 MED ORDER — MELOXICAM 15 MG PO TABS: 15.0000 mg | ORAL_TABLET | Freq: Every day | ORAL | 0 refills | Status: DC
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
## 2022-11-26 NOTE — Patient Instructions (Signed)

## 2023-01-07 ENCOUNTER — Ambulatory Visit: Payer: 59 | Admitting: Podiatry

## 2023-03-13 ENCOUNTER — Emergency Department (HOSPITAL_BASED_OUTPATIENT_CLINIC_OR_DEPARTMENT_OTHER): Payer: 59

## 2023-03-13 ENCOUNTER — Other Ambulatory Visit: Payer: Self-pay

## 2023-03-13 ENCOUNTER — Emergency Department (HOSPITAL_BASED_OUTPATIENT_CLINIC_OR_DEPARTMENT_OTHER)
Admission: EM | Admit: 2023-03-13 | Discharge: 2023-03-13 | Disposition: A | Discharge status: Home / Self Care | Payer: 59 | Attending: Emergency Medicine | Admitting: Emergency Medicine

## 2023-03-13 ENCOUNTER — Encounter (HOSPITAL_BASED_OUTPATIENT_CLINIC_OR_DEPARTMENT_OTHER): Payer: Self-pay

## 2023-03-13 DIAGNOSIS — R519 Headache, unspecified: Secondary | ICD-10-CM | POA: Insufficient documentation

## 2023-03-13 DIAGNOSIS — E119 Type 2 diabetes mellitus without complications: Secondary | ICD-10-CM | POA: Diagnosis not present

## 2023-03-13 DIAGNOSIS — R112 Nausea with vomiting, unspecified: Secondary | ICD-10-CM | POA: Diagnosis not present

## 2023-03-13 DIAGNOSIS — R42 Dizziness and giddiness: Secondary | ICD-10-CM | POA: Insufficient documentation

## 2023-03-13 LAB — BASIC METABOLIC PANEL
Anion gap: 9 (ref 5–15)
BUN: 18 mg/dL (ref 6–20)
CO2: 23 mmol/L (ref 22–32)
Calcium: 9.9 mg/dL (ref 8.9–10.3)
Chloride: 106 mmol/L (ref 98–111)
Creatinine, Ser: 0.6 mg/dL (ref 0.44–1.00)
GFR, Estimated: >60 mL/min (ref >60)
Glucose, Bld: 97 mg/dL (ref 70–99)
Potassium: 4.1 mmol/L (ref 3.5–5.1)
Sodium: 138 mmol/L (ref 135–145)

## 2023-03-13 LAB — URINALYSIS, ROUTINE W REFLEX MICROSCOPIC
Bilirubin Urine: NEGATIVE
Glucose, UA: NEGATIVE mg/dL
Hgb urine dipstick: NEGATIVE
Ketones, ur: NEGATIVE mg/dL
Leukocytes,Ua: NEGATIVE
Nitrite: NEGATIVE
Protein, ur: NEGATIVE mg/dL
Specific Gravity, Urine: 1.027 (ref 1.005–1.030)
pH: 5.5 (ref 5.0–8.0)

## 2023-03-13 LAB — CBC
HCT: 36.9 % (ref 36.0–46.0)
Hemoglobin: 12 g/dL (ref 12.0–15.0)
MCH: 27.3 pg (ref 26.0–34.0)
MCHC: 32.5 g/dL (ref 30.0–36.0)
MCV: 84.1 fL (ref 80.0–100.0)
Platelets: 359 10*3/uL (ref 150–400)
RBC: 4.39 MIL/uL (ref 3.87–5.11)
RDW: 14.6 % (ref 11.5–15.5)
WBC: 7.4 10*3/uL (ref 4.0–10.5)
nRBC: 0 % (ref 0.0–0.2)

## 2023-03-13 LAB — PREGNANCY, URINE: Preg Test, Ur: NEGATIVE

## 2023-03-13 MED ORDER — DIPHENHYDRAMINE HCL 50 MG/ML IJ SOLN
50.0000 mg | Freq: Once | INTRAMUSCULAR | Status: AC
  Administered 2023-03-13: 50 mg via INTRAVENOUS
  Filled 2023-03-13: qty 1

## 2023-03-13 MED ORDER — SODIUM CHLORIDE 0.9 % IV BOLUS
1000.0000 mL | Freq: Once | INTRAVENOUS | Status: AC
  Administered 2023-03-13: 1000 mL via INTRAVENOUS

## 2023-03-13 MED ORDER — PROCHLORPERAZINE MALEATE 10 MG PO TABS: 10.0000 mg | ORAL_TABLET | Freq: Two times a day (BID) | ORAL | 0 refills | Status: DC | PRN

## 2023-03-13 MED ORDER — MECLIZINE HCL 25 MG PO TABS: 25.0000 mg | ORAL_TABLET | Freq: Three times a day (TID) | ORAL | 0 refills | Status: DC | PRN

## 2023-03-13 MED ORDER — IOHEXOL 350 MG/ML SOLN
100.0000 mL | Freq: Once | INTRAVENOUS | Status: AC | PRN
  Administered 2023-03-13: 75 mL via INTRAVENOUS

## 2023-03-13 MED ORDER — PROCHLORPERAZINE EDISYLATE 10 MG/2ML IJ SOLN
10.0000 mg | Freq: Once | INTRAMUSCULAR | Status: AC
  Administered 2023-03-13: 10 mg via INTRAVENOUS
  Filled 2023-03-13: qty 2

## 2023-03-13 MED ORDER — MECLIZINE HCL 25 MG PO TABS
50.0000 mg | ORAL_TABLET | Freq: Once | ORAL | Status: AC
  Administered 2023-03-13: 50 mg via ORAL
  Filled 2023-03-13: qty 2

## 2023-03-13 NOTE — ED Provider Notes (Signed)
[place] EMERGENCY DEPARTMENT AT [facility] Provider Note   CSN: [id number] Arrival date & time: 03/13/23  1746     History  Chief Complaint  Patient presents with   Dizziness    Katherine Mitchell is a 47 y.o. female.  The history is provided by the patient, medical records and the spouse. No language interpreter was used.  Dizziness Quality:  Head spinning, vertigo and room spinning Severity:  Severe Onset quality:  Gradual Duration:  6 days Timing:  Constant Progression:  Waxing and waning Chronicity:  Recurrent Context: head movement   Relieved by:  Nothing Worsened by:  Eye movement and turning head Ineffective treatments:  None tried Associated symptoms: headaches, nausea and vomiting   Associated symptoms: no chest pain, no diarrhea, no palpitations, no shortness of breath, no syncope, no vision changes and no weakness        Home Medications Prior to Admission medications   Medication Sig Start Date End Date Taking? Authorizing Provider  Accu-Chek Softclix Lancets lancets Use as instructed to check blood sugar 3 times a day. Dx code e11.65 Patient not taking: Reported on 07/24/2022 07/23/22   Arnette Felts, FNP  atorvastatin (LIPITOR) 10 MG tablet Take 1 tablet (10 mg total) by mouth daily. At bedtime Patient not taking: Reported on 07/24/2022 03/26/22 03/26/23  Arnette Felts, FNP  blood glucose meter kit and supplies KIT Dispense based on patient and insurance preference. Use up to four times daily as directed. (FOR ICD-9 250.00, 250.01). Patient not taking: Reported on 07/24/2022 11/14/21   Arnette Felts, FNP  Dulaglutide (TRULICITY) 0.75 MG/0.5ML SOPN Inject 0.75 mg into the skin once a week. 07/24/22   Arnette Felts, FNP  glucose blood (ACCU-CHEK GUIDE) test strip Use as instructed to check blood sugar 3 times a day. Dx code e11.65 Patient not taking: Reported on 07/24/2022 07/23/22   Arnette Felts, FNP  meloxicam (MOBIC) 15 MG tablet Take 1 tablet  (15 mg total) by mouth daily. 11/26/22   Louann Sjogren, DPM      Allergies    Patient has no known allergies.    Review of Systems   Review of Systems  Constitutional:  Negative for chills, fatigue and fever.  HENT:  Negative for congestion.   Eyes:  Negative for visual disturbance.  Respiratory:  Negative for cough, chest tightness and shortness of breath.   Cardiovascular:  Negative for chest pain, palpitations and syncope.  Gastrointestinal:  Positive for nausea and vomiting. Negative for abdominal pain and diarrhea.  Genitourinary:  Negative for dysuria.  Musculoskeletal:  Negative for back pain, neck pain and neck stiffness.  Skin:  Negative for pallor and rash.  Neurological:  Positive for dizziness and headaches. Negative for syncope, speech difficulty, weakness, light-headedness and numbness.  Psychiatric/Behavioral:  Negative for agitation and confusion.   All other systems reviewed and are negative.   Physical Exam Updated Vital Signs BP 130/81 (BP Location: Left Arm)   Pulse 82   Temp 98.4 F (36.9 C)   Resp 18   Ht 5\' 5"  (1.651 m)   Wt 98 kg   SpO2 100%   BMI 35.94 kg/m  Physical Exam Vitals and nursing note reviewed.  Constitutional:      General: She is not in acute distress.    Appearance: She is well-developed. She is not ill-appearing, toxic-appearing or diaphoretic.  HENT:     Head: Normocephalic and atraumatic.     Nose: No congestion or rhinorrhea.     Mouth/Throat:  Mouth: Mucous membranes are dry.     Pharynx: No oropharyngeal exudate or posterior oropharyngeal erythema.  Eyes:     General:        Right eye: No discharge.        Left eye: No discharge.     Extraocular Movements:     Right eye: Abnormal extraocular motion and nystagmus present.     Left eye: Abnormal extraocular motion and nystagmus present.     Conjunctiva/sclera: Conjunctivae normal.     Pupils: Pupils are equal, round, and reactive to light.     Comments: Nystagmus  when looking to the right in both eyes.  Cardiovascular:     Rate and Rhythm: Normal rate and regular rhythm.     Heart sounds: No murmur heard. Pulmonary:     Effort: Pulmonary effort is normal. No respiratory distress.     Breath sounds: Normal breath sounds. No wheezing, rhonchi or rales.  Chest:     Chest wall: No tenderness.  Abdominal:     Palpations: Abdomen is soft.     Tenderness: There is no abdominal tenderness. There is no guarding or rebound.  Musculoskeletal:        General: No swelling or tenderness.     Cervical back: Neck supple.     Right lower leg: No edema.     Left lower leg: No edema.  Skin:    General: Skin is warm and dry.     Capillary Refill: Capillary refill takes less than 2 seconds.     Findings: No erythema or rash.  Neurological:     General: No focal deficit present.     Mental Status: She is alert.     Sensory: No sensory deficit.     Motor: No weakness.  Psychiatric:        Mood and Affect: Mood normal.     ED Results / Procedures / Treatments   Labs (all labs ordered are listed, but only abnormal results are displayed) Labs Reviewed  BASIC METABOLIC PANEL  CBC  URINALYSIS, ROUTINE W REFLEX MICROSCOPIC  PREGNANCY, URINE    EKG EKG Interpretation Date/Time:  Wednesday March 13 2023 17:55:43 EDT Ventricular Rate:  79 PR Interval:  144 QRS Duration:  72 QT Interval:  376 QTC Calculation: 431 R Axis:   17  Text Interpretation: Normal sinus rhythm Abnormal ECG No previous ECGs available no prior ECG for comparison. No STEMI Confirmed by Theda Belfast ([id number]) on 03/13/2023 7:12:49 PM  Radiology CT ANGIO HEAD NECK W WO CM  Result Date: 03/13/2023 CLINICAL DATA:  Dizziness EXAM: CT ANGIOGRAPHY HEAD AND NECK WITH AND WITHOUT CONTRAST TECHNIQUE: Multidetector CT imaging of the head and neck was performed using the standard protocol during bolus administration of intravenous contrast. Multiplanar CT image reconstructions and MIPs were  obtained to evaluate the vascular anatomy. Carotid stenosis measurements (when applicable) are obtained utilizing NASCET criteria, using the distal internal carotid diameter as the denominator. RADIATION DOSE REDUCTION: This exam was performed according to the departmental dose-optimization program which includes automated exposure control, adjustment of the mA and/or kV according to patient size and/or use of iterative reconstruction technique. CONTRAST:  75mL OMNIPAQUE IOHEXOL 350 MG/ML SOLN COMPARISON:  None Available. FINDINGS: CT HEAD FINDINGS Brain: There is no mass, hemorrhage or extra-axial collection. The size and configuration of the ventricles and extra-axial CSF spaces are normal. There is no acute or chronic infarction. The brain parenchyma is normal. Skull: The visualized skull base, calvarium and extracranial soft  tissues are normal. Sinuses/Orbits: No fluid levels or advanced mucosal thickening of the visualized paranasal sinuses. No mastoid or middle ear effusion. The orbits are normal. CTA NECK FINDINGS SKELETON: There is no bony spinal canal stenosis. No lytic or blastic lesion. OTHER NECK: Normal pharynx, larynx and major salivary glands. No cervical lymphadenopathy. Unremarkable thyroid gland. UPPER CHEST: No pneumothorax or pleural effusion. No nodules or masses. AORTIC ARCH: There is no calcific atherosclerosis of the aortic arch. Conventional 3 vessel aortic branching pattern. RIGHT CAROTID SYSTEM: Normal without aneurysm, dissection or stenosis. LEFT CAROTID SYSTEM: Normal without aneurysm, dissection or stenosis. VERTEBRAL ARTERIES: Left dominant configuration.There is no dissection, occlusion or flow-limiting stenosis to the skull base (V1-V3 segments). CTA HEAD FINDINGS POSTERIOR CIRCULATION: --Vertebral arteries: Normal V4 segments. --Inferior cerebellar arteries: Normal. --Basilar artery: Normal. --Superior cerebellar arteries: Normal. --Posterior cerebral arteries (PCA): Normal.  ANTERIOR CIRCULATION: --Intracranial internal carotid arteries: Normal. --Anterior cerebral arteries (ACA): Normal. Both A1 segments are present. Patent anterior communicating artery (a-comm). --Middle cerebral arteries (MCA): Normal. VENOUS SINUSES: As permitted by contrast timing, patent. ANATOMIC VARIANTS: None Review of the MIP images confirms the above findings. IMPRESSION: Normal CTA of the head and neck. Electronically Signed   By: Deatra Robinson M.D.   On: 03/13/2023 20:04    Procedures Procedures    Medications Ordered in ED Medications  sodium chloride 0.9 % bolus 1,000 mL (0 mLs Intravenous Stopped 03/13/23 2140)  prochlorperazine (COMPAZINE) injection 10 mg (10 mg Intravenous Given 03/13/23 1939)  diphenhydrAMINE (BENADRYL) injection 50 mg (50 mg Intravenous Given 03/13/23 1939)  meclizine (ANTIVERT) tablet 50 mg (50 mg Oral Given 03/13/23 1938)  iohexol (OMNIPAQUE) 350 MG/ML injection 100 mL (75 mLs Intravenous Contrast Given 03/13/23 1902)    ED Course/ Medical Decision Making/ A&P                             Medical Decision Making Amount and/or Complexity of Data Reviewed Labs: ordered. Radiology: ordered.  Risk Prescription drug management.    Keleigh Stbernard is a 47 y.o. female with a past medical history significant for ulcerative colitis, diabetes, and previous anemia who presents with dizziness.  According to patient, for the last 6 days, she has been having a head and room spinning dizziness but has waxed and waned.  She has never had a diagnosis of vertigo but several years ago she did have an episode of dizziness lasting several days.  She reports it is worse when she turns her head or eyes and seems to be different based on positioning.  She reports some headache and photophobia.  Headache is moderate.  She reports no vision changes or speech difficulties.  Denies numbness, tingling, or weakness of extremities.  Denies any neck pain or neck stiffness.  Reports no history  of TIA or stroke.  No other complaints reported.  No alcohol or drug use reported.  On exam, lungs clear.  Chest nontender.  Abdomen nontender.  Patient has intact sensation, strength, and pulse in extremities.  Normal finger-nose-finger testing.  Patient did have some nystagmus when looking to the right.  She got dizzy when sitting up.  No carotid bruit.  Exam otherwise unremarkable.  No other focal neurologic deficits.  Pupils are symmetric and reactive.  Clinically suspect patient has vertigo.  We will give a headache cocktail, including fluids as well as some meclizine.  Due to her 6 days of persistent symptoms however in the setting of headache,  we will get imaging to rule out a bleed or posterior circulation problem and get a CTA head and neck.  If this is reassuring, and she feels better after medications, suspect vertigo and likely discharge home with outpatient ENT follow-up.  If symptoms are persistent or there are concerning findings on CT scan, would consider transfer for MRI but at this time do not feel she needs it initially.  Patient agrees with this plan.  Anticipate reassessment after workup.   Patient's imaging was reassuring from the CTA of the head and neck.  No evidence of aneurysm, dissection, mass, or bleed.  No large stroke seen.  Patient reported resolution of symptoms after the headache medicine and meclizine.  Suspect vertigo and some migraine.  Given resolution, we agree with not doing transfer for MRI and discharging patient instead with medication prescriptions.  Patient will follow-up with PCP and given number for ENT.  Patient no questions or concerns and was discharged in good condition.         Final Clinical Impression(s) / ED Diagnoses Final diagnoses:  Dizziness  Vertigo  Acute nonintractable headache, unspecified headache type    Rx / DC Orders ED Discharge Orders          Ordered    prochlorperazine (COMPAZINE) 10 MG tablet  2 times daily PRN         03/13/23 2322    meclizine (ANTIVERT) 25 MG tablet  3 times daily PRN        03/13/23 2322            Clinical Impression: 1. Dizziness   2. Vertigo   3. Acute nonintractable headache, unspecified headache type     Disposition: Discharge  Condition: Good  I have discussed the results, Dx and Tx plan with the pt(& family if present). He/she/they expressed understanding and agree(s) with the plan. Discharge instructions discussed at great length. Strict return precautions discussed and pt &/or family have verbalized understanding of the instructions. No further questions at time of discharge.    New Prescriptions   MECLIZINE (ANTIVERT) 25 MG TABLET    Take 1 tablet (25 mg total) by mouth 3 (three) times daily as needed for dizziness.   PROCHLORPERAZINE (COMPAZINE) 10 MG TABLET    Take 1 tablet (10 mg total) by mouth 2 (two) times daily as needed for nausea or vomiting.    Follow Up: Arnette Felts, FNP [address] [place] [address] [phone]     Suzanna Obey, MD [address] [place] [address] [phone]   with ENT  [facility] Emergency Department at [facility] [address] [place] [address] [phone]       Sequita Wise, Canary Brim, MD 03/13/23 [phone]

## 2023-03-13 NOTE — Discharge Instructions (Signed)
Your history, exam, and evaluation today are consistent with vertigo causing your dizziness and symptoms.  The medicine for the vertigo and headache medicine improved your symptoms.  I suspect there was a component of some migrainous type headache with the headache and light sensitivity.  The CT imaging was reassuring and based on your improvement in symptoms we agreed together with a shared decision making conversation to hold on transfer for MRI at this time.  Please rest and stay hydrated and follow-up with your primary doctor and ENT if symptoms persist.  If any symptoms change or worsen acutely, please return to the nearest emergency department.

## 2023-03-13 NOTE — ED Triage Notes (Signed)
Patient here POV from Home.  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 Associated with N/V as well. Some General Weakness and Nausea still.   No Known Fever. Some Headache. No Diarrhea.   NAD Noted during Triage. A&Ox4. GCS 15. Ambulatory.

## 2023-03-21 ENCOUNTER — Other Ambulatory Visit: Payer: Self-pay | Admitting: Nurse Practitioner

## 2023-03-21 DIAGNOSIS — Z1231 Encounter for screening mammogram for malignant neoplasm of breast: Secondary | ICD-10-CM

## 2023-03-28 ENCOUNTER — Encounter: Payer: Self-pay | Admitting: Nurse Practitioner

## 2023-03-28 ENCOUNTER — Ambulatory Visit (INDEPENDENT_AMBULATORY_CARE_PROVIDER_SITE_OTHER): Payer: 59 | Admitting: Nurse Practitioner

## 2023-03-28 VITALS — BP 110/60 | HR 82 | Temp 98.6°F | Ht 65.0 in | Wt 215.4 lb

## 2023-03-28 DIAGNOSIS — Z2821 Immunization not carried out because of patient refusal: Secondary | ICD-10-CM | POA: Diagnosis not present

## 2023-03-28 DIAGNOSIS — Z0001 Encounter for general adult medical examination with abnormal findings: Secondary | ICD-10-CM

## 2023-03-28 DIAGNOSIS — E7800 Pure hypercholesterolemia, unspecified: Secondary | ICD-10-CM

## 2023-03-28 DIAGNOSIS — Z87898 Personal history of other specified conditions: Secondary | ICD-10-CM | POA: Diagnosis not present

## 2023-03-28 DIAGNOSIS — E119 Type 2 diabetes mellitus without complications: Secondary | ICD-10-CM

## 2023-03-28 DIAGNOSIS — E6609 Other obesity due to excess calories: Secondary | ICD-10-CM

## 2023-03-28 DIAGNOSIS — Z6835 Body mass index (BMI) 35.0-35.9, adult: Secondary | ICD-10-CM

## 2023-03-28 DIAGNOSIS — Z0000 Encounter for general adult medical examination without abnormal findings: Secondary | ICD-10-CM

## 2023-03-28 LAB — POCT URINALYSIS DIP (CLINITEK)
Bilirubin, UA: NEGATIVE
Blood, UA: NEGATIVE
Glucose, UA: NEGATIVE mg/dL
Ketones, POC UA: NEGATIVE mg/dL
Leukocytes, UA: NEGATIVE
Nitrite, UA: NEGATIVE
POC PROTEIN,UA: NEGATIVE
Spec Grav, UA: >=1.03 — AB (ref 1.010–1.025)
Urobilinogen, UA: 0.2 E.U./dL
pH, UA: 6 (ref 5.0–8.0)

## 2023-03-28 NOTE — Assessment & Plan Note (Signed)
Cholesterol levels were slightly elevated at last visit. She is not taking her cholesterol medications due to concern about side effects
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
## 2023-03-28 NOTE — Assessment & Plan Note (Signed)
She is encouraged to strive for BMI less than 30 to decrease cardiac risk. Advised to aim for at least 150 minutes of exercise per week. Encouraged to add another day to her exercise regimen to total 150 minutes.

## 2023-03-28 NOTE — Assessment & Plan Note (Signed)
She had not taken medications for diabetes due to concerns about side effects, explained these side effects in detail and verbalized understanding. Encouraged in the future I am available to answer any questions about medications to avoid worsening her health.

## 2023-03-28 NOTE — Assessment & Plan Note (Addendum)

## 2023-03-29 LAB — CMP14+EGFR
ALT: 9 IU/L (ref 0–32)
AST: 15 IU/L (ref 0–40)
Albumin: 4.5 g/dL (ref 3.9–4.9)
Alkaline Phosphatase: 74 IU/L (ref 44–121)
BUN/Creatinine Ratio: 20 (ref 9–23)
BUN: 13 mg/dL (ref 6–24)
Bilirubin Total: 0.7 mg/dL (ref 0.0–1.2)
CO2: 20 mmol/L (ref 20–29)
Calcium: 9.6 mg/dL (ref 8.7–10.2)
Chloride: 103 mmol/L (ref 96–106)
Creatinine, Ser: 0.66 mg/dL (ref 0.57–1.00)
Globulin, Total: 2.9 g/dL (ref 1.5–4.5)
Glucose: 112 mg/dL — ABNORMAL HIGH (ref 70–99)
Potassium: 4.5 mmol/L (ref 3.5–5.2)
Sodium: 137 mmol/L (ref 134–144)
Total Protein: 7.4 g/dL (ref 6.0–8.5)
eGFR: 109 mL/min/{1.73_m2} (ref >59)

## 2023-03-29 LAB — HEMOGLOBIN A1C
Est. average glucose Bld gHb Est-mCnc: 154 mg/dL
Hgb A1c MFr Bld: 7 % — ABNORMAL HIGH (ref 4.8–5.6)

## 2023-03-29 LAB — LIPID PANEL
Chol/HDL Ratio: 4 ratio (ref 0.0–4.4)
Cholesterol, Total: 174 mg/dL (ref 100–199)
HDL: 43 mg/dL (ref >39)
LDL Chol Calc (NIH): 119 mg/dL — ABNORMAL HIGH (ref 0–99)
Triglycerides: 60 mg/dL (ref 0–149)
VLDL Cholesterol Cal: 12 mg/dL (ref 5–40)

## 2023-03-29 LAB — CBC WITH DIFFERENTIAL/PLATELET
Basophils Absolute: 0.1 10*3/uL (ref 0.0–0.2)
Basos: 1 %
EOS (ABSOLUTE): 0.2 10*3/uL (ref 0.0–0.4)
Eos: 3 %
Hematocrit: 35.7 % (ref 34.0–46.6)
Hemoglobin: 11.6 g/dL (ref 11.1–15.9)
Immature Grans (Abs): 0 10*3/uL (ref 0.0–0.1)
Immature Granulocytes: 0 %
Lymphocytes Absolute: 2.3 10*3/uL (ref 0.7–3.1)
Lymphs: 40 %
MCH: 26.7 pg (ref 26.6–33.0)
MCHC: 32.5 g/dL (ref 31.5–35.7)
MCV: 82 fL (ref 79–97)
Monocytes Absolute: 0.4 10*3/uL (ref 0.1–0.9)
Monocytes: 6 %
Neutrophils Absolute: 2.9 10*3/uL (ref 1.4–7.0)
Neutrophils: 50 %
Platelets: 363 10*3/uL (ref 150–450)
RBC: 4.34 x10E6/uL (ref 3.77–5.28)
RDW: 14.1 % (ref 11.7–15.4)
WBC: 5.8 10*3/uL (ref 3.4–10.8)

## 2023-03-29 LAB — MICROALBUMIN / CREATININE URINE RATIO
Creatinine, Urine: 164.4 mg/dL
Microalb/Creat Ratio: 6 mg/g creat (ref 0–29)
Microalbumin, Urine: 9.4 ug/mL

## 2023-05-07 ENCOUNTER — Ambulatory Visit
Admission: RE | Admit: 2023-05-07 | Discharge: 2023-05-07 | Disposition: A | Discharge status: Home / Self Care | Payer: 59 | Source: Ambulatory Visit | Attending: Nurse Practitioner | Admitting: Nurse Practitioner

## 2023-05-07 DIAGNOSIS — Z1231 Encounter for screening mammogram for malignant neoplasm of breast: Secondary | ICD-10-CM

## 2023-05-07 NOTE — Progress Notes (Signed)
Please follow up on PCP. No SDOH needs at this time.

## 2023-05-10 ENCOUNTER — Other Ambulatory Visit: Payer: Self-pay | Admitting: Nurse Practitioner

## 2023-05-10 DIAGNOSIS — R928 Other abnormal and inconclusive findings on diagnostic imaging of breast: Secondary | ICD-10-CM

## 2023-05-21 ENCOUNTER — Ambulatory Visit
Admission: RE | Admit: 2023-05-21 | Discharge: 2023-05-21 | Disposition: A | Discharge status: Home / Self Care | Payer: 59 | Source: Ambulatory Visit | Attending: Nurse Practitioner | Admitting: Nurse Practitioner

## 2023-05-21 DIAGNOSIS — R928 Other abnormal and inconclusive findings on diagnostic imaging of breast: Secondary | ICD-10-CM

## 2023-05-21 DIAGNOSIS — N6341 Unspecified lump in right breast, subareolar: Secondary | ICD-10-CM | POA: Diagnosis not present

## 2023-05-21 DIAGNOSIS — N6001 Solitary cyst of right breast: Secondary | ICD-10-CM | POA: Diagnosis not present

## 2023-05-23 NOTE — Progress Notes (Signed)
The patient attended 05/07/23 screening event where her screening results were WNL. At the event the patient noted she had insurance and no SDOH needs.  Per chart review pt has a pcp and last OV was 03/28/23. Chart review also indicates two future appts with PCP. No additional Health equity team support indicated at this time.

## 2023-06-12 LAB — HM DIABETES EYE EXAM

## 2023-06-17 ENCOUNTER — Encounter: Payer: Self-pay | Admitting: Nurse Practitioner

## 2023-07-29 ENCOUNTER — Ambulatory Visit: Payer: 59 | Admitting: Nurse Practitioner

## 2023-09-25 DIAGNOSIS — K625 Hemorrhage of anus and rectum: Secondary | ICD-10-CM | POA: Diagnosis not present

## 2023-09-25 DIAGNOSIS — K5130 Ulcerative (chronic) rectosigmoiditis without complications: Secondary | ICD-10-CM | POA: Diagnosis not present

## 2023-10-03 DIAGNOSIS — K529 Noninfective gastroenteritis and colitis, unspecified: Secondary | ICD-10-CM | POA: Diagnosis not present

## 2023-10-03 DIAGNOSIS — K921 Melena: Secondary | ICD-10-CM | POA: Diagnosis not present

## 2023-10-03 DIAGNOSIS — K625 Hemorrhage of anus and rectum: Secondary | ICD-10-CM | POA: Diagnosis not present

## 2023-10-03 DIAGNOSIS — K5130 Ulcerative (chronic) rectosigmoiditis without complications: Secondary | ICD-10-CM | POA: Diagnosis not present

## 2023-10-03 LAB — HM SIGMOIDOSCOPY

## 2024-03-30 ENCOUNTER — Encounter: Payer: Self-pay | Admitting: Nurse Practitioner

## 2024-03-31 DIAGNOSIS — E119 Type 2 diabetes mellitus without complications: Secondary | ICD-10-CM | POA: Diagnosis not present

## 2024-03-31 DIAGNOSIS — R030 Elevated blood-pressure reading, without diagnosis of hypertension: Secondary | ICD-10-CM | POA: Diagnosis not present

## 2024-03-31 DIAGNOSIS — E559 Vitamin D deficiency, unspecified: Secondary | ICD-10-CM | POA: Diagnosis not present

## 2024-03-31 DIAGNOSIS — E782 Mixed hyperlipidemia: Secondary | ICD-10-CM | POA: Diagnosis not present

## 2024-03-31 DIAGNOSIS — E278 Other specified disorders of adrenal gland: Secondary | ICD-10-CM | POA: Diagnosis not present

## 2024-03-31 DIAGNOSIS — Z6836 Body mass index (BMI) 36.0-36.9, adult: Secondary | ICD-10-CM | POA: Diagnosis not present

## 2024-04-14 DIAGNOSIS — E559 Vitamin D deficiency, unspecified: Secondary | ICD-10-CM | POA: Diagnosis not present

## 2024-04-14 DIAGNOSIS — E6609 Other obesity due to excess calories: Secondary | ICD-10-CM | POA: Diagnosis not present

## 2024-04-14 DIAGNOSIS — N951 Menopausal and female climacteric states: Secondary | ICD-10-CM | POA: Diagnosis not present

## 2024-04-14 DIAGNOSIS — D509 Iron deficiency anemia, unspecified: Secondary | ICD-10-CM | POA: Diagnosis not present

## 2024-04-14 DIAGNOSIS — E1165 Type 2 diabetes mellitus with hyperglycemia: Secondary | ICD-10-CM | POA: Diagnosis not present

## 2024-04-14 DIAGNOSIS — E782 Mixed hyperlipidemia: Secondary | ICD-10-CM | POA: Diagnosis not present

## 2024-05-04 DIAGNOSIS — D509 Iron deficiency anemia, unspecified: Secondary | ICD-10-CM | POA: Diagnosis not present

## 2024-05-04 DIAGNOSIS — E782 Mixed hyperlipidemia: Secondary | ICD-10-CM | POA: Diagnosis not present

## 2024-05-04 DIAGNOSIS — E1169 Type 2 diabetes mellitus with other specified complication: Secondary | ICD-10-CM | POA: Diagnosis not present

## 2024-05-04 DIAGNOSIS — K5180 Other ulcerative colitis without complications: Secondary | ICD-10-CM | POA: Diagnosis not present

## 2024-05-04 DIAGNOSIS — E6609 Other obesity due to excess calories: Secondary | ICD-10-CM | POA: Diagnosis not present

## 2024-06-02 ENCOUNTER — Inpatient Hospital Stay

## 2024-06-02 ENCOUNTER — Inpatient Hospital Stay: Attending: Hematology and Oncology | Admitting: Hematology and Oncology

## 2024-06-02 VITALS — BP 112/67 | HR 80 | Temp 97.6°F | Resp 16 | Ht 65.0 in | Wt 206.8 lb

## 2024-06-02 DIAGNOSIS — E119 Type 2 diabetes mellitus without complications: Secondary | ICD-10-CM | POA: Insufficient documentation

## 2024-06-02 DIAGNOSIS — D509 Iron deficiency anemia, unspecified: Secondary | ICD-10-CM | POA: Diagnosis not present

## 2024-06-02 DIAGNOSIS — K51911 Ulcerative colitis, unspecified with rectal bleeding: Secondary | ICD-10-CM | POA: Insufficient documentation

## 2024-06-02 DIAGNOSIS — Z807 Family history of other malignant neoplasms of lymphoid, hematopoietic and related tissues: Secondary | ICD-10-CM | POA: Diagnosis not present

## 2024-06-02 DIAGNOSIS — D500 Iron deficiency anemia secondary to blood loss (chronic): Secondary | ICD-10-CM | POA: Diagnosis not present
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
## 2024-06-17 ENCOUNTER — Other Ambulatory Visit: Payer: Self-pay | Admitting: Nurse Practitioner

## 2024-06-17 DIAGNOSIS — Z1231 Encounter for screening mammogram for malignant neoplasm of breast: Secondary | ICD-10-CM

## 2024-06-18 DIAGNOSIS — H11133 Conjunctival pigmentations, bilateral: Secondary | ICD-10-CM | POA: Diagnosis not present

## 2024-06-18 DIAGNOSIS — E119 Type 2 diabetes mellitus without complications: Secondary | ICD-10-CM | POA: Diagnosis not present

## 2024-06-18 DIAGNOSIS — H0289 Other specified disorders of eyelid: Secondary | ICD-10-CM | POA: Diagnosis not present

## 2024-06-18 DIAGNOSIS — H1045 Other chronic allergic conjunctivitis: Secondary | ICD-10-CM | POA: Diagnosis not present

## 2024-06-23 DIAGNOSIS — E1165 Type 2 diabetes mellitus with hyperglycemia: Secondary | ICD-10-CM | POA: Diagnosis not present

## 2024-06-23 DIAGNOSIS — D509 Iron deficiency anemia, unspecified: Secondary | ICD-10-CM | POA: Diagnosis not present

## 2024-06-24 ENCOUNTER — Ambulatory Visit
Admission: RE | Admit: 2024-06-24 | Discharge: 2024-06-24 | Disposition: A | Discharge status: Home / Self Care | Source: Ambulatory Visit | Attending: Nurse Practitioner | Admitting: Nurse Practitioner

## 2024-06-24 DIAGNOSIS — Z1231 Encounter for screening mammogram for malignant neoplasm of breast: Secondary | ICD-10-CM | POA: Diagnosis not present

## 2024-07-20 DIAGNOSIS — K5130 Ulcerative (chronic) rectosigmoiditis without complications: Secondary | ICD-10-CM | POA: Diagnosis not present

## 2024-08-11 DIAGNOSIS — Z1211 Encounter for screening for malignant neoplasm of colon: Secondary | ICD-10-CM | POA: Diagnosis not present

## 2024-08-11 DIAGNOSIS — K529 Noninfective gastroenteritis and colitis, unspecified: Secondary | ICD-10-CM | POA: Diagnosis not present

## 2024-08-31 ENCOUNTER — Emergency Department (HOSPITAL_COMMUNITY)
Admission: EM | Admit: 2024-08-31 | Discharge: 2024-09-01 | Discharge status: Against Medical Advice | Attending: Emergency Medicine | Admitting: Emergency Medicine

## 2024-08-31 ENCOUNTER — Encounter (HOSPITAL_COMMUNITY): Payer: Self-pay

## 2024-08-31 ENCOUNTER — Other Ambulatory Visit: Payer: Self-pay

## 2024-08-31 DIAGNOSIS — R509 Fever, unspecified: Secondary | ICD-10-CM | POA: Diagnosis not present

## 2024-08-31 DIAGNOSIS — J101 Influenza due to other identified influenza virus with other respiratory manifestations: Secondary | ICD-10-CM | POA: Diagnosis not present

## 2024-08-31 DIAGNOSIS — Z5321 Procedure and treatment not carried out due to patient leaving prior to being seen by health care provider: Secondary | ICD-10-CM | POA: Diagnosis not present

## 2024-08-31 DIAGNOSIS — J111 Influenza due to unidentified influenza virus with other respiratory manifestations: Secondary | ICD-10-CM | POA: Diagnosis not present

## 2024-08-31 DIAGNOSIS — R059 Cough, unspecified: Secondary | ICD-10-CM | POA: Diagnosis not present

## 2024-08-31 LAB — RESP PANEL BY RT-PCR (RSV, FLU A&B, COVID)  RVPGX2
Influenza A by PCR: POSITIVE — AB
Influenza B by PCR: NEGATIVE
Resp Syncytial Virus by PCR: NEGATIVE
SARS Coronavirus 2 by RT PCR: NEGATIVE

## 2024-08-31 LAB — GROUP A STREP BY PCR: Group A Strep by PCR: NOT DETECTED

## 2024-08-31 MED ORDER — IBUPROFEN 400 MG PO TABS
ORAL_TABLET | ORAL | Status: AC
  Filled 2024-08-31: qty 1

## 2024-08-31 MED ORDER — IBUPROFEN 400 MG PO TABS
600.0000 mg | ORAL_TABLET | Freq: Once | ORAL | Status: AC
  Administered 2024-08-31: 600 mg via ORAL

## 2024-08-31 NOTE — ED Triage Notes (Signed)
 Pt c/o fever, sore throat, headache, and chills since yesterday. Highest temp at home was 102.8 x 3 minutes ago. Ast tylenol  was at 1700.

## 2024-09-01 ENCOUNTER — Ambulatory Visit

## 2024-09-01 ENCOUNTER — Emergency Department (HOSPITAL_COMMUNITY)
Admission: EM | Admit: 2024-09-01 | Discharge: 2024-09-01 | Disposition: A | Discharge status: Home / Self Care | Source: Home / Self Care | Attending: Emergency Medicine | Admitting: Emergency Medicine

## 2024-09-01 DIAGNOSIS — J111 Influenza due to unidentified influenza virus with other respiratory manifestations: Secondary | ICD-10-CM | POA: Insufficient documentation

## 2024-09-01 DIAGNOSIS — J101 Influenza due to other identified influenza virus with other respiratory manifestations: Secondary | ICD-10-CM | POA: Diagnosis not present

## 2024-09-01 MED ORDER — ACETAMINOPHEN 325 MG PO TABS
650.0000 mg | ORAL_TABLET | Freq: Once | ORAL | Status: DC
  Filled 2024-09-01: qty 2

## 2024-09-01 MED ORDER — OSELTAMIVIR PHOSPHATE 75 MG PO CAPS: 75.0000 mg | ORAL_CAPSULE | Freq: Two times a day (BID) | ORAL | 0 refills | Status: AC

## 2024-09-01 NOTE — Discharge Instructions (Addendum)
 Tamiflu  sent into the pharmacy.  Continue taking Tylenol  and ibuprofen  for fever and bodyaches.  Increase your hydration.  Return for any concerning symptoms.

## 2024-09-01 NOTE — ED Provider Notes (Signed)
 " [place] EM[facility]GENC[facility] DEPARTMENT AT [facility] Provider Note   CSN: [id number] Arrival date & time: 09/01/24  1540     Patient presents with: Influenza   Katherine Mitchell is a 48 y.o. female.   48 year old female presents today for concern of flulike symptoms since Monday.  Has been taking Tylenol  and ibuprofen .  She was here yesterday and had a positive respiratory panel for flu.  She states she could not wait after being in the emergency department until 2:30 AM and not being seen so she left prior to completion of her care.  She returns today for prescription for Tamiflu .  Denies any chest pain, shortness of breath.  Tolerating fluids.  The history is provided by the patient. No language interpreter was used.       Prior to Admission medications  Medication Sig Start Date End Date Taking? Authorizing Provider  oseltamivir  (TAMIFLU ) 75 MG capsule Take 1 capsule (75 mg total) by mouth every 12 (twelve) hours. 09/01/24  Yes Hildegard, Chun Sellen, PA-C    Allergies: Patient has no known allergies.    Review of Systems  Constitutional:  Positive for chills and fever. Negative for activity change.  HENT:  Positive for congestion, sinus pressure and sore throat. Negative for trouble swallowing and voice change.   Respiratory:  Positive for cough. Negative for shortness of breath.   Cardiovascular:  Negative for chest pain.  Gastrointestinal:  Negative for nausea and vomiting.  All other systems reviewed and are negative.   Updated Vital Signs BP 116/76 (BP Location: Right Arm)   Pulse 98   Temp (!) 100.6 F (38.1 C)   Resp 20   SpO2 98%   Physical Exam Vitals and nursing note reviewed.  Constitutional:      General: She is not in acute distress.    Appearance: Normal appearance. She is not ill-appearing.  HENT:     Head: Normocephalic and atraumatic.     Nose: Nose normal.     Mouth/Throat:     Mouth: Mucous membranes are moist.     Pharynx: No oropharyngeal  exudate or posterior oropharyngeal erythema.  Eyes:     Conjunctiva/sclera: Conjunctivae normal.  Cardiovascular:     Rate and Rhythm: Normal rate and regular rhythm.  Pulmonary:     Effort: Pulmonary effort is normal. No respiratory distress.  Musculoskeletal:        General: No deformity. Normal range of motion.     Cervical back: Normal range of motion.  Skin:    Findings: No rash.  Neurological:     General: No focal deficit present.     Mental Status: She is alert and oriented to person, place, and time.     (all labs ordered are listed, but only abnormal results are displayed) Labs Reviewed - No data to display  EKG: None  Radiology: No results found.   Procedures   Medications Ordered in the ED  acetaminophen  (TYLENOL ) tablet 650 mg (has no administration in time range)                                    Medical Decision Making Risk OTC drugs. Prescription drug management.   48 year old female presents with URI symptoms.  Influenza positive yesterday.  Would like a Tamiflu  prescription. Discussed possible side effects.  Discussed possibly being out of the 48-hour window.  Patient voices understanding and would like to proceed.  No prior history of abnormal kidney function.  Patient is appropriate for discharge.  Supportive care discussed.  Discussed increasing hydration.  Final diagnoses:  Influenza    ED Discharge Orders          Ordered    oseltamivir  (TAMIFLU ) 75 MG capsule  Every 12 hours        09/01/24 1631               Hildegard Loge, PA-C 09/01/24 1637    Long, Joshua G, MD 09/01/24 2336  "

## 2024-09-01 NOTE — ED Triage Notes (Signed)
 Pt is back due to being flu positive.  She states that she was dx yesterday when she was seen here but she left without completing care and is back today as she would like a prescription for tamiflu 

## 2024-09-07 ENCOUNTER — Other Ambulatory Visit: Payer: Self-pay | Admitting: *Deleted

## 2024-09-07 DIAGNOSIS — D500 Iron deficiency anemia secondary to blood loss (chronic): Secondary | ICD-10-CM

## 2024-09-08 ENCOUNTER — Inpatient Hospital Stay: Admitting: Hematology and Oncology

## 2024-09-08 ENCOUNTER — Inpatient Hospital Stay

## 2024-09-08 ENCOUNTER — Telehealth: Payer: Self-pay | Admitting: Hematology and Oncology

## 2024-09-08 NOTE — Telephone Encounter (Signed)
 I returned patient's call from voicemail left on 09/07/2024 to cancel lab and MD appointments on 09/08/2024. Patient stated she will call back to reschedule appointments once insurance coverage is squared away.

## 2024-09-24 ENCOUNTER — Encounter: Payer: Self-pay | Admitting: *Deleted

## 2024-09-24 NOTE — Progress Notes (Signed)
 Katherine Mitchell                                          MRN: [id number]   09/24/2024   The [facility] Quality Team Specialist reviewed this patient medical record for the purposes of chart review for care gap closure. The following were reviewed: chart review for care gap closure-glycemic status assessment.    [facility] Quality Team
